# Patient Record
Sex: Male | Born: 1940 | Race: White | Hispanic: No | Marital: Married | Smoking: Former smoker
Health system: Southern US, Community
[De-identification: ages and names within clinical notes are randomized; demographics above are authoritative.]

## PROBLEM LIST (undated history)

## (undated) DIAGNOSIS — C649 Malignant neoplasm of unspecified kidney, except renal pelvis: Secondary | ICD-10-CM

## (undated) DIAGNOSIS — E785 Hyperlipidemia, unspecified: Secondary | ICD-10-CM

## (undated) DIAGNOSIS — I10 Essential (primary) hypertension: Secondary | ICD-10-CM

## (undated) HISTORY — PX: OTHER SURGICAL HISTORY: SHX169

## (undated) HISTORY — PX: TONSILLECTOMY: SUR1361

## (undated) HISTORY — DX: Hyperlipidemia, unspecified: E78.5

## (undated) HISTORY — DX: Essential (primary) hypertension: I10

## (undated) HISTORY — PX: HERNIA REPAIR: SHX51

## (undated) HISTORY — DX: Malignant neoplasm of unspecified kidney, except renal pelvis: C64.9

---

## 1999-06-17 ENCOUNTER — Ambulatory Visit (HOSPITAL_BASED_OUTPATIENT_CLINIC_OR_DEPARTMENT_OTHER): Admission: RE | Admit: 1999-06-17 | Discharge: 1999-06-17 | Payer: Self-pay | Admitting: Surgery

## 1999-08-06 ENCOUNTER — Ambulatory Visit (HOSPITAL_COMMUNITY): Admission: RE | Admit: 1999-08-06 | Discharge: 1999-08-06 | Payer: Self-pay | Admitting: Gastroenterology

## 2004-06-18 ENCOUNTER — Ambulatory Visit (HOSPITAL_COMMUNITY): Admission: RE | Admit: 2004-06-18 | Discharge: 2004-06-18 | Payer: Self-pay | Admitting: Internal Medicine

## 2007-01-28 ENCOUNTER — Encounter: Admission: RE | Admit: 2007-01-28 | Discharge: 2007-01-28 | Payer: Self-pay | Admitting: Internal Medicine

## 2009-05-29 ENCOUNTER — Encounter (INDEPENDENT_AMBULATORY_CARE_PROVIDER_SITE_OTHER): Payer: Self-pay | Admitting: Surgery

## 2009-05-29 ENCOUNTER — Ambulatory Visit (HOSPITAL_BASED_OUTPATIENT_CLINIC_OR_DEPARTMENT_OTHER): Admission: RE | Admit: 2009-05-29 | Discharge: 2009-05-29 | Payer: Self-pay | Admitting: Surgery

## 2011-02-20 HISTORY — PX: KIDNEY SURGERY: SHX687

## 2011-03-31 LAB — BASIC METABOLIC PANEL
BUN: 11 mg/dL (ref 6–23)
Calcium: 8.8 mg/dL (ref 8.4–10.5)
Chloride: 103 mEq/L (ref 96–112)
Creatinine, Ser: 0.69 mg/dL (ref 0.4–1.5)
Sodium: 137 mEq/L (ref 135–145)

## 2011-05-06 NOTE — Op Note (Signed)
Jose Monroe, ELWOOD                 ACCOUNT NO.:  [id number]   MEDICAL RECORD NO.:  [id number]          PATIENT TYPE:  AMB   LOCATION:  [facility]                          FACILITY:  [facility]   PHYSICIAN:  Thornton Park. Daphine Deutscher, MD  DATE OF BIRTH:  05-06-1941   DATE OF PROCEDURE:  DATE OF DISCHARGE:                               OPERATIVE REPORT   PREOPERATIVE DIAGNOSIS:  Left inguinal hernia.   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   SURGEON:  Thornton Park. Daphine Deutscher, MD   ASSISTANT:  None.   ANESTHESIA:  General by LMA.   DESCRIPTION OF PROCEDURE:  This 70 year old gentleman was taken to room  3 of [facility] on Tuesday, May 29, 2009, given general  anesthesia.  The abdomen was clipped and then prepped with a Techni-Care  equivalent, draped sterilely.  An oblique incision was made in the left  inguinal region, carried down through the fairly generous fat layer to  the external oblique.  These were incised along the fibers opening  allowing me to then mobilize the cord and pull it up.  I went proximally  to the ring and looked for a sac.  I did not see an obvious sac, but saw  this large mass going into the scrotum which was a large lipoma.  I  dissected this from the cord and then brought it up to the internal  ring.  I then did a high ligation with 2 sutures of 4-0 Vicryl.  This  was then retracted up into the abdomen.  No other indirect defects were  seen.  The floor was somewhat lax, so what I did is I repaired the floor  with a piece of Ultrapro mesh, suturing along the inguinal ligament with  running 2-0 Prolene.  Medially with running 2-0 Prolene cutting it,  bringing around the cord structures, and sutured it to itself.  This was  then tucked beneath the external oblique which was then closed over it.  Area was injected with 10 mL of 0.25% Marcaine.  The subcutaneous was  closed 4-0 Vicryl and the skin was closed with subcuticular 4-0  Vicryl,  Benzoin, and Steri-Strips.  The patient tolerated the procedure well,  was taken to the recovery room.  He will be given some Vicodin for pain.  He will be followed up in the office in 2 weeks.      Thornton Park Daphine Deutscher, MD  Electronically Signed     MBM/MEDQ  D:  05/29/2009  T:  05/30/2009  Job:  [id number]   cc:   Georgann Housekeeper, MD

## 2014-01-25 ENCOUNTER — Ambulatory Visit (INDEPENDENT_AMBULATORY_CARE_PROVIDER_SITE_OTHER): Payer: Medicare Other | Admitting: Family Medicine

## 2014-01-25 ENCOUNTER — Other Ambulatory Visit (INDEPENDENT_AMBULATORY_CARE_PROVIDER_SITE_OTHER): Payer: Medicare Other

## 2014-01-25 ENCOUNTER — Telehealth: Payer: Self-pay | Admitting: *Deleted

## 2014-01-25 VITALS — BP 130/62 | HR 91 | Temp 98.1°F | Resp 16 | Wt 186.1 lb

## 2014-01-25 DIAGNOSIS — M1710 Unilateral primary osteoarthritis, unspecified knee: Secondary | ICD-10-CM | POA: Insufficient documentation

## 2014-01-25 DIAGNOSIS — M6530 Trigger finger, unspecified finger: Secondary | ICD-10-CM | POA: Insufficient documentation

## 2014-01-25 DIAGNOSIS — M19049 Primary osteoarthritis, unspecified hand: Secondary | ICD-10-CM | POA: Insufficient documentation

## 2014-01-25 NOTE — Telephone Encounter (Signed)
Patient phoned wanting to know how long Dr. Creig Hines wants him to wear the finger brace.  Please advise.   CB# [phone]

## 2014-01-25 NOTE — Assessment & Plan Note (Signed)
We did attempt to try to do an injection but secondary to patient's fusion I do not know how much this will help. We discussed wearing gloves at night and over-the-counter medicines it can help with arthritic pain. We'll followup again in 3-4 weeks for further evaluation.

## 2014-01-25 NOTE — Assessment & Plan Note (Signed)
Patient was also given a splint today. Patient will wear the splint and we discussed different range of motion exercises he can be beneficial. Post injection instructions given. If this continues he may need a repeat injection in the future.

## 2014-01-25 NOTE — Telephone Encounter (Signed)
48 hours then only at night for 2 weeks.

## 2014-01-25 NOTE — Assessment & Plan Note (Signed)
Patient did have an injection today with some improvement. Patient was given information about potential viscous supplementation. In addition this patient was given a brace that he can wear while he is on vacation. Patient will do home exercises and we discussed over-the-counter medications and can be beneficial. We'll see patient back again in 3-4 weeks for further evaluation.

## 2014-01-25 NOTE — Progress Notes (Signed)
Pre-visit discussion using our clinic review tool. No additional management support is needed unless otherwise documented below in the visit note.  

## 2014-01-25 NOTE — Patient Instructions (Addendum)
Very nice to meet you Wear brace with activity Ice 20 minutes 2 times a day to the knee.  Try exercises for knee 3 times a day Take tylenol 650 mg three times a day is the best evidence based medicine we have for arthritis.  Glucosamine sulfate 750mg  twice a day is a supplement that has been shown to help moderate to severe arthritis. Vitamin D 4000 IU daily Fish oil 2 grams daily.  Tumeric 500mg  twice daily.  B6 100mg  daily Capsaicin topically up to four times a day may also help with pain. Cortisone injections are an option if these interventions do not seem to make a difference or need more relief.  If cortisone injections do not help, there are different types of shots that may help but they take longer to take effect.  We can discuss this at follow up.  It's important that you continue to stay active. Controlling your weight is important.  Consider physical therapy to strengthen muscles around the joint that hurts to take pressure off of the joint itself. Shoe inserts with good arch support may be helpful.  [employer] orthotics at [employer] could help.  Water aerobics and cycling with low resistance are the best two types of exercise for arthritis. Come back and see me in 3 weeks.

## 2014-01-25 NOTE — Progress Notes (Signed)
CC: Knee pain hand pain.   HPI: Patient is a 73 year old gentleman coming in with a complaint of knee pain. Patient states that his knee pain is bilaterally but the right side is much worse. Patient states it hurts more when he relates or if the weather changes. Patient denies any mechanical symptoms such as locking or giving out on him or denies any radiation or numbness. Patient states that he does have to stop time to time secondary to pain. Patient denies any nighttime awakening. 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 Patient denies any fevers or chills or abnormal weight loss at this time. Patient has been followed by other providers in the [facility]. The patient's knee easily tried Tylenol which does take the edge off. Patient rates the severity of 6/10.  In addition this patient has had bilateral hand pain. Patient has had pain in the right hand that seems to have a finger that it stuck from time to time. Patient points to the fourth finger. Patient denies any significant pain with this toe. Patient's left hand no does have significant pain mostly of the second finger and is unable to bend it anymore at this time. Patient denies any injuries. Patient rates the severity of these finger pains of approximately 5/10. Patient is left-handed and has not tried any relief modalities other than Tylenol.   Past medical, surgical, family and social history reviewed. Medications reviewed all in the electronic medical record.   Review of Systems: No headache, visual changes, nausea, vomiting, diarrhea, constipation, dizziness, abdominal pain, skin rash, fevers, chills, night sweats, weight loss, swollen lymph nodes, body aches, joint swelling, muscle aches, chest pain, shortness of breath, mood changes.   Objective:    Blood  pressure 130/62, pulse 91, temperature 98.1 F (36.7 C), temperature source Oral, resp. rate 16, weight 186 lb 1.9 oz (84.423 kg), SpO2 97.00%.   General: No apparent distress alert and oriented x3 mood and affect normal, dressed appropriately.  HEENT: Pupils equal, extraocular movements intact Respiratory: Patient's speak in full sentences and does not appear short of breath Cardiovascular: No lower extremity edema, non tender, no erythema Skin: Warm dry intact with no signs of infection or rash on extremities or on axial skeleton. Abdomen: Soft nontender Neuro: Cranial nerves II through XII are intact, neurovascularly intact in all extremities with 2+ DTRs and 2+ pulses. Lymph: No lymphadenopathy of posterior or anterior cervical chain or axillae bilaterally.  Gait normal with good balance and coordination.  MSK: Non tender with full range of motion and good stability and symmetric strength and tone of shoulders, elbows, wrist, hip, and ankles bilaterally.  Knee: Bilateral On inspection patient does have osteoarthritic changes. Palpation shows tenderness of the medial and lateral joint line bilaterally. ROM full in flexion and extension and lower leg rotation but does have significant crepitus Ligaments with solid consistent endpoints including ACL, PCL, LCL, MCL. Positive Mcmurray's, Apley's, and Thessalonian tests. painful patellar compression. Patellar glide with severe crepitus. Patellar and quadriceps tendons unremarkable. Hamstring and quadriceps strength is normal.  Contralateral knee unremarkable  MSK US performed of: Right fourth finger This study was ordered, performed, and interpreted by Charlann Boxer D.O.  Patient does have what appears to be a nodule within the tendon sheath of the A1 pulley. Underlying bone does show any abnormalities except for some mild  osteoporosis of the joint. Left hand second finger shows the patient does have severe osteoarthritis with autofusion of  the joint.  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  After informed written and verbal consent, patient was seated on exam table. Right knee was prepped with alcohol swab and utilizing anterolateral approach, patient's right knee space was injected with 4:1  marcaine 0.5%: Kenalog 40mg /dL. Patient tolerated the procedure well without immediate complications.  After verbal consent patient was prepped with alcohol swabs and with a 26-gauge half inch needle patient was injected with 0.5 cc of 0.5% Marcaine and 0.5 cc of Kenalog 40 mg/dL in 2 different areas. Patient had one injection into the A1 pulley of the trigger finger on the right hand as well as the PIP joint on the second finger of the left hand. Patient tolerated the procedures very well and had decreased pain immediately.  Impression and Recommendations:     This case required medical decision making of moderate complexity.

## 2014-01-26 NOTE — Telephone Encounter (Signed)
Phoned patient and spoke with spouse and relayed MD's recommendations regarding finger brance.  Understanding and appreciation verbalized.

## 2014-04-13 ENCOUNTER — Ambulatory Visit (INDEPENDENT_AMBULATORY_CARE_PROVIDER_SITE_OTHER): Payer: Medicare Other | Admitting: Family Medicine

## 2014-04-13 VITALS — BP 130/82 | HR 80 | Wt 181.0 lb

## 2014-04-13 DIAGNOSIS — M1710 Unilateral primary osteoarthritis, unspecified knee: Secondary | ICD-10-CM

## 2014-04-13 NOTE — Assessment & Plan Note (Signed)
Patient had Synvisc injection once a day. We discussed continued home exercises as well as over-the-counter medications. Patient will continue with good shoes. Patient back in one week for the second in the series of injections.

## 2014-04-13 NOTE — Progress Notes (Signed)
  CC: Knee pain, bilaterally  HPI: Patient is a 73 year old gentleman coming in with a complaint of knee pain. Patient is returning for his bilateral knee pain. Patient has had this pain for quite some time and has failed conservative therapy including corticosteroid injections at last visit. Patient states that did help for approximately 1 week and then seems to come back. In the past patient has done other things such as 2 rounds of physical therapy as well as anti-inflammatories without any significant improvement. Patient is also try topical medication without any significant permanent. Patient states it is affecting his activities of daily living and is wondering what else we can do.    Past medical, surgical, family and social history reviewed. Medications reviewed all in the electronic medical record.   Review of Systems: No headache, visual changes, nausea, vomiting, diarrhea, constipation, dizziness, abdominal pain, skin rash, fevers, chills, night sweats, weight loss, swollen lymph nodes, body aches, joint swelling, muscle aches, chest pain, shortness of breath, mood changes.   Objective:    Blood pressure 130/82, pulse 80, weight 181 lb (82.101 kg), SpO2 98.00%.   General: No apparent distress alert and oriented x3 mood and affect normal, dressed appropriately.  HEENT: Pupils equal, extraocular movements intact Respiratory: Patient's speak in full sentences and does not appear short of breath Cardiovascular: No lower extremity edema, non tender, no erythema Skin: Warm dry intact with no signs of infection or rash on extremities or on axial skeleton. Abdomen: Soft nontender Neuro: Cranial nerves II through XII are intact, neurovascularly intact in all extremities with 2+ DTRs and 2+ pulses. Lymph: No lymphadenopathy of posterior or anterior cervical chain or axillae bilaterally.  Gait normal with good balance and coordination.  MSK: Non tender with full range of motion and good  stability and symmetric strength and tone of shoulders, elbows, wrist, hip, and ankles bilaterally.  Knee: Bilateral On inspection patient does have osteoarthritic changes. Palpation shows tenderness of the medial and lateral joint line bilaterally. ROM full in flexion and extension and lower leg rotation but does have significant crepitus Ligaments with solid consistent endpoints including ACL, PCL, LCL, MCL. Positive Mcmurray's, Apley's, and Thessalonian tests. painful patellar compression. Patellar glide with severe crepitus. Patellar and quadriceps tendons unremarkable. Hamstring and quadriceps strength is normal.  Contralateral knee unremarkable  Right knee 16 mg/2.5 mL of Synvisc (sodium hyaluronate) in a prefilled syringe was injected easily into the knee through a 22-gauge needle.  Left knee 16 mg/2.5 mL of Synvisc (sodium hyaluronate) in a prefilled syringe was injected easily into the knee through a 22-gauge needle.   Impression and Recommendations:     This case required medical decision making of moderate complexity.

## 2014-04-13 NOTE — Patient Instructions (Signed)
Good to see you We will do another injection next week Continue the exercises and wear brace only with significant activity.

## 2014-04-20 ENCOUNTER — Ambulatory Visit (INDEPENDENT_AMBULATORY_CARE_PROVIDER_SITE_OTHER): Payer: Medicare Other | Admitting: Family Medicine

## 2014-04-20 VITALS — BP 124/76 | HR 80

## 2014-04-20 DIAGNOSIS — M1710 Unilateral primary osteoarthritis, unspecified knee: Secondary | ICD-10-CM

## 2014-04-20 NOTE — Assessment & Plan Note (Signed)
Synvisc #2 injection today. Patient will continue the exercises, bracing, and icing. Patient will come back again in one week for his third and final Synvisc injection.

## 2014-04-20 NOTE — Progress Notes (Signed)
  CC: Knee pain, bilaterally  HPI: Patient is a 73 year old gentleman coming in with a complaint of knee pain. Patient was seen previously was started on the Synvisc injections after failing all conservative therapy. Patient did have one injection already and states that he has had significant intermittent is not on any pain medications. Patient states his decrease the pain by approximately 50%. No new symptoms.    Past medical, surgical, family and social history reviewed. Medications reviewed all in the electronic medical record.   Review of Systems: No headache, visual changes, nausea, vomiting, diarrhea, constipation, dizziness, abdominal pain, skin rash, fevers, chills, night sweats, weight loss, swollen lymph nodes, body aches, joint swelling, muscle aches, chest pain, shortness of breath, mood changes.   Objective:    Blood pressure 124/76, pulse 80, SpO2 98.00%.   General: No apparent distress alert and oriented x3 mood and affect normal, dressed appropriately.  HEENT: Pupils equal, extraocular movements intact Respiratory: Patient's speak in full sentences and does not appear short of breath Cardiovascular: No lower extremity edema, non tender, no erythema Skin: Warm dry intact with no signs of infection or rash on extremities or on axial skeleton. Abdomen: Soft nontender Neuro: Cranial nerves II through XII are intact, neurovascularly intact in all extremities with 2+ DTRs and 2+ pulses. Lymph: No lymphadenopathy of posterior or anterior cervical chain or axillae bilaterally.  Gait normal with good balance and coordination.  MSK: Non tender with full range of motion and good stability and symmetric strength and tone of shoulders, elbows, wrist, hip, and ankles bilaterally.  Knee: Bilateral On inspection patient does have osteoarthritic changes. Palpation shows tenderness of the medial and lateral joint line bilaterally. ROM full in flexion and extension and lower leg rotation but  does have significant crepitus Ligaments with solid consistent endpoints including ACL, PCL, LCL, MCL. Positive Mcmurray's, Apley's, and Thessalonian tests. painful patellar compression. Patellar glide with severe crepitus. Patellar and quadriceps tendons unremarkable. Hamstring and quadriceps strength is normal.  Contralateral knee unremarkable  Right knee 16 mg/2.5 mL of Synvisc (sodium hyaluronate) in a prefilled syringe was injected easily into the knee through a 22-gauge needle.  Left knee 16 mg/2.5 mL of Synvisc (sodium hyaluronate) in a prefilled syringe was injected easily into the knee through a 22-gauge needle.   Impression and Recommendations:     This case required medical decision making of moderate complexity.

## 2014-04-20 NOTE — Patient Instructions (Signed)
Good to see you Keep on doing what you are doing.  See you in 1 week.

## 2014-04-27 ENCOUNTER — Ambulatory Visit (INDEPENDENT_AMBULATORY_CARE_PROVIDER_SITE_OTHER): Payer: Medicare Other | Admitting: Family Medicine

## 2014-04-27 DIAGNOSIS — M6530 Trigger finger, unspecified finger: Secondary | ICD-10-CM

## 2014-04-27 DIAGNOSIS — M1710 Unilateral primary osteoarthritis, unspecified knee: Secondary | ICD-10-CM

## 2014-04-27 NOTE — Assessment & Plan Note (Signed)
Injected again ring finger 5/7

## 2014-04-27 NOTE — Assessment & Plan Note (Signed)
Third and final injection of Synvisc today in the knees bilaterally. We discussed continued home exercises as well as active. Discuss the possibility of topical anti-inflammatories which patient declined. Patient will start over-the-counter medications it was suggested. Patient will continue icing and will followup in 4 weeks for further evaluation the

## 2014-04-27 NOTE — Progress Notes (Signed)
  CC: Knee pain, bilaterally  HPI: Patient is a 73 year old gentleman coming in with a complaint of knee pain. Patient was seen previously was started on the Synvisc injections after failing all conservative therapy.He is here for injection #3 bilaterally.  Patient states that the pain is approximately 80% improved. No new symptoms.   Past medical, surgical, family and social history reviewed. Medications reviewed all in the electronic medical record.   Review of Systems: No headache, visual changes, nausea, vomiting, diarrhea, constipation, dizziness, abdominal pain, skin rash, fevers, chills, night sweats, weight loss, swollen lymph nodes, body aches, joint swelling, muscle aches, chest pain, shortness of breath, mood changes.   Objective:    There were no vitals taken for this visit.   General: No apparent distress alert and oriented x3 mood and affect normal, dressed appropriately.  HEENT: Pupils equal, extraocular movements intact Respiratory: Patient's speak in full sentences and does not appear short of breath Cardiovascular: No lower extremity edema, non tender, no erythema Skin: Warm dry intact with no signs of infection or rash on extremities or on axial skeleton. Abdomen: Soft nontender Neuro: Cranial nerves II through XII are intact, neurovascularly intact in all extremities with 2+ DTRs and 2+ pulses. Lymph: No lymphadenopathy of posterior or anterior cervical chain or axillae bilaterally.  Gait normal with good balance and coordination.  MSK: Non tender with full range of motion and good stability and symmetric strength and tone of shoulders, elbows, wrist, hip, and ankles bilaterally.  Knee: Bilateral On inspection patient does have osteoarthritic changes. Palpation shows tenderness of the medial and lateral joint line bilaterally. ROM full in flexion and extension and lower leg rotation but does have significant crepitus Ligaments with solid consistent endpoints including  ACL, PCL, LCL, MCL. Positive Mcmurray's, Apley's, and Thessalonian tests. painful patellar compression. Patellar glide with severe crepitus. Patellar and quadriceps tendons unremarkable. Hamstring and quadriceps strength is normal.  Contralateral knee unremarkable  Right knee 16 mg/2.5 mL of Synvisc (sodium hyaluronate) in a prefilled syringe was injected easily into the knee through a 22-gauge needle.  Left knee 16 mg/2.5 mL of Synvisc (sodium hyaluronate) in a prefilled syringe was injected easily into the knee through a 22-gauge needle.   Impression and Recommendations:     This case required medical decision making of moderate complexity.

## 2014-05-25 ENCOUNTER — Ambulatory Visit (INDEPENDENT_AMBULATORY_CARE_PROVIDER_SITE_OTHER): Payer: Medicare Other | Admitting: Family Medicine

## 2014-05-25 VITALS — BP 130/70 | HR 82 | Ht 71.0 in | Wt 183.0 lb

## 2014-05-25 DIAGNOSIS — M1710 Unilateral primary osteoarthritis, unspecified knee: Secondary | ICD-10-CM

## 2014-05-25 DIAGNOSIS — M6530 Trigger finger, unspecified finger: Secondary | ICD-10-CM

## 2014-05-25 NOTE — Assessment & Plan Note (Signed)
Patient had another injection today and tolerated the procedure well. Patient will wear a splint for the next 24 hours and wear nightly thereafter. Patient will continue with manual massage and home exercises that has been given to him previously. Patient will follow up again in 2 weeks if not completely resolved.

## 2014-05-25 NOTE — Patient Instructions (Signed)
Good to see you Have Marge call. Would love to see her.  For your finger.  Wear splint next 24 hours then at night for next week.  Should improve over the next week. See me when you need me.

## 2014-05-25 NOTE — Progress Notes (Signed)
  CC: Knee pain, bilaterally  HPI: Patient is a 73 year old gentleman coming in with a complaint of knee pain. Patient completed the Synvisc injections last month. Patient states he is very happy with the results. Patient states he is approximately 90% better. Has some mild discomfort from time to time especially when he gets cold-weather outside but overall can do all activities of daily living. Patient is able to go up and downstairs without any pain.  Patient also has a trigger finger again on the were right ring finger. Patient had this injection multiple months ago. Patient had some improvement for a while but starts to have worsening triggering. Patient states that the pain is minimal. No numbness associated.   Past medical, surgical, family and social history reviewed. Medications reviewed all in the electronic medical record.   Review of Systems: No headache, visual changes, nausea, vomiting, diarrhea, constipation, dizziness, abdominal pain, skin rash, fevers, chills, night sweats, weight loss, swollen lymph nodes, body aches, joint swelling, muscle aches, chest pain, shortness of breath, mood changes.   Objective:    Blood pressure 130/70, pulse 82, height 5\' 11"  (1.803 m), weight 183 lb (83.008 kg), SpO2 94.00%.   General: No apparent distress alert and oriented x3 mood and affect normal, dressed appropriately.  HEENT: Pupils equal, extraocular movements intact Respiratory: Patient's speak in full sentences and does not appear short of breath Cardiovascular: No lower extremity edema, non tender, no erythema Skin: Warm dry intact with no signs of infection or rash on extremities or on axial skeleton. Abdomen: Soft nontender Neuro: Cranial nerves II through XII are intact, neurovascularly intact in all extremities with 2+ DTRs and 2+ pulses. Lymph: No lymphadenopathy of posterior or anterior cervical chain or axillae bilaterally.  Gait normal with good balance and coordination.  MSK:  Non tender with full range of motion and good stability and symmetric strength and tone of shoulders, elbows, wrist, hip, and ankles bilaterally.  Knee: Bilateral On inspection patient does have osteoarthritic changes. The patient has minimal tenderness over the medial and lateral joint line bilaterally. ROM full in flexion and extension and lower leg rotation but does have significant crepitus Ligaments with solid consistent endpoints including ACL, PCL, LCL, MCL. Negative Mcmurray's, Apley's, and Thessalonian tests. painful patellar compression. Patellar glide with mild to moderate crepitus. Patellar and quadriceps tendons unremarkable. Hamstring and quadriceps strength is normal.  Contralateral knee unremarkable  Hand exam shows the patient does have a nodule at the A1 pulley of the ring finger. Mild tenderness to palpation. Neurovascular intact distally.  Limited muscular skeletal ultrasound was done and shows the patient is a nodule within the tendon sheath of the A1 pulley. Patient was prepped with alcohol swabs and with a 25-gauge 1 inch needle was injected with 0.5 cc of 0.5% Marcaine and 0.5 cc of Kenalog 40 mg/dL under ultrasound guidance patient tolerated the procedure very well. Post injection instructions given.     Impression and Recommendations:     This case required medical decision making of moderate complexity.

## 2014-05-25 NOTE — Assessment & Plan Note (Signed)
Patient is doing remarkably better after the Synvisc series. Patient encouraged to continue the home exercises as well as the icing program. Patient will continue to do well and we can or should repeat the viscous supplementation every 6 months if necessary. Optimistic that this will last longer than that.  Spent greater than 25 minutes with patient face-to-face and had greater than 50% of counseling including as described above in assessment and plan.

## 2014-12-27 ENCOUNTER — Encounter: Payer: Self-pay | Admitting: Podiatry

## 2014-12-27 ENCOUNTER — Ambulatory Visit (INDEPENDENT_AMBULATORY_CARE_PROVIDER_SITE_OTHER): Payer: Medicare Other | Admitting: Podiatry

## 2014-12-27 VITALS — BP 133/71 | HR 65 | Resp 12 | Ht 71.5 in | Wt 180.0 lb

## 2014-12-27 DIAGNOSIS — R0989 Other specified symptoms and signs involving the circulatory and respiratory systems: Secondary | ICD-10-CM

## 2014-12-27 DIAGNOSIS — L608 Other nail disorders: Secondary | ICD-10-CM

## 2014-12-27 NOTE — Progress Notes (Signed)
   Subjective:    Patient ID: Jose Monroe, male    DOB: 1941/08/21, 74 y.o.   MRN: [id number]  HPI Comments: N debridement L 10 toenails  D and O long-term C elongated, tenderness to toenails A diabetes, arthritis and Macular degeneration T home pedicure    Patient states that he is a prediabetic and has difficulty trimming his nails. He admits that he has cause some bleeding when he attempts to trim his toenails which is done recently.  He denies any history of foot ulceration or claudication  He is a former smoker for 40 years smoking 1 pack daily. He discontinued smoking approximately 10 years ago   Review of Systems  HENT: Positive for congestion and sinus pressure.   Musculoskeletal: Positive for arthralgias.  All other systems reviewed and are negative.      Objective:   Physical Exam  Orientated 3  Vascular: DP pulses 2/4 bilaterally PT pulses 0/4 bilaterally Capillary reflex immediate bilaterally  Neurological: Ankle reflex equal and reactive bilaterally Vibratory sensation intact bilaterally Sensation to 10 g monofilament wire intact 5/5 bilaterally  Dermatological: Atrophic skin without hair growth bilaterally There are small areas of abrasions with crusting noted on dorsal aspect toes 2-4 left and 2 and 5 right These lesions apparently were cause when patient attempted trim his toenails The toenails are neatly trimmed 10 Nails of cells are mildly incurvated with occasional minor color changes  Musculoskeletal: Medial longitudinal arch contour bilaterally There is no restriction ankle, subtalar, midtarsal joints bilaterally     Assessment & Plan:   Assessment: Decreased posterior tibial pulses bilaterally rule out vascular disease Note neurological deep deficits Noninfected multiple abrasions on the dorsum of right and left feet Mildly incurvated toenails that need minimal trimming  Plan: Patient advised that I had difficulty palpating the  posterior tibial pulses and 1 to have further evaluation Refer patient to vascular lab for lower extremity arterial Doppler bilaterally for the indication of decreased posterior tibial pulses, prediabetes and 40 year history of smoking  I did debrided several nails as non-dystrophic Advised patient that he should not further trim his nails as he seems to have difficulty He could go to the pedicurist for nail debridement as nails are mildly incurvated  Notify patient results of lower extremity arterial Doppler

## 2014-12-27 NOTE — Patient Instructions (Addendum)
Refer patient to vascular lab for lower extremity arterial Doppler bilaterally for the indication of decreased pedal pulses and prediabetes Our office will contact you with the results of your arterial Doppler examination  Diabetes and Foot Care Diabetes may cause you to have problems because of poor blood supply (circulation) to your feet and legs. This may cause the skin on your feet to become thinner, break easier, and heal more slowly. Your skin may become dry, and the skin may peel and crack. You may also have nerve damage in your legs and feet causing decreased feeling in them. You may not notice minor injuries to your feet that could lead to infections or more serious problems. Taking care of your feet is one of the most important things you can do for yourself.  HOME CARE INSTRUCTIONS  Wear shoes at all times, even in the house. Do not go barefoot. Bare feet are easily injured.  Check your feet daily for blisters, cuts, and redness. If you cannot see the bottom of your feet, use a mirror or ask someone for help.  Wash your feet with warm water (do not use hot water) and mild soap. Then pat your feet and the areas between your toes until they are completely dry. Do not soak your feet as this can dry your skin.  Apply a moisturizing lotion or petroleum jelly (that does not contain alcohol and is unscented) to the skin on your feet and to dry, brittle toenails. Do not apply lotion between your toes.  Trim your toenails straight across. Do not dig under them or around the cuticle. File the edges of your nails with an emery board or nail file.  Do not cut corns or calluses or try to remove them with medicine.  Wear clean socks or stockings every day. Make sure they are not too tight. Do not wear knee-high stockings since they may decrease blood flow to your legs.  Wear shoes that fit properly and have enough cushioning. To break in new shoes, wear them for just a few hours a day. This  prevents you from injuring your feet. Always look in your shoes before you put them on to be sure there are no objects inside.  Do not cross your legs. This may decrease the blood flow to your feet.  If you find a minor scrape, cut, or break in the skin on your feet, keep it and the skin around it clean and dry. These areas may be cleansed with mild soap and water. Do not cleanse the area with peroxide, alcohol, or iodine.  When you remove an adhesive bandage, be sure not to damage the skin around it.  If you have a wound, look at it several times a day to make sure it is healing.  Do not use heating pads or hot water bottles. They may burn your skin. If you have lost feeling in your feet or legs, you may not know it is happening until it is too late.  Make sure your health care provider performs a complete foot exam at least annually or more often if you have foot problems. Report any cuts, sores, or bruises to your health care provider immediately. SEEK MEDICAL CARE IF:   You have an injury that is not healing.  You have cuts or breaks in the skin.  You have an ingrown nail.  You notice redness on your legs or feet.  You feel burning or tingling in your legs or feet.  You have pain or cramps in your legs and feet.  Your legs or feet are numb.  Your feet always feel cold. SEEK IMMEDIATE MEDICAL CARE IF:   There is increasing redness, swelling, or pain in or around a wound.  There is a red line that goes up your leg.  Pus is coming from a wound.  You develop a fever or as directed by your health care provider.  You notice a bad smell coming from an ulcer or wound. Document Released: 12/05/2000 Document Revised: 08/10/2013 Document Reviewed: 05/17/2013 [facility] Patient Information 2015 [place], [place]. This information is not intended to replace advice given to you by your health care provider. Make sure you discuss any questions you have with your health care provider.

## 2015-01-05 ENCOUNTER — Ambulatory Visit (HOSPITAL_COMMUNITY): Payer: Medicare Other | Attending: Internal Medicine | Admitting: *Deleted

## 2015-01-05 DIAGNOSIS — R0989 Other specified symptoms and signs involving the circulatory and respiratory systems: Secondary | ICD-10-CM | POA: Insufficient documentation

## 2015-01-05 NOTE — Progress Notes (Signed)
Arterial Doppler Single Level Performed

## 2015-04-24 ENCOUNTER — Encounter: Payer: Self-pay | Admitting: Cardiology

## 2015-04-24 ENCOUNTER — Ambulatory Visit (INDEPENDENT_AMBULATORY_CARE_PROVIDER_SITE_OTHER): Payer: Medicare Other | Admitting: Cardiology

## 2015-04-24 ENCOUNTER — Encounter: Payer: Self-pay | Admitting: *Deleted

## 2015-04-24 VITALS — BP 130/68 | HR 85 | Ht 71.5 in | Wt 182.5 lb

## 2015-04-24 DIAGNOSIS — E785 Hyperlipidemia, unspecified: Secondary | ICD-10-CM | POA: Diagnosis not present

## 2015-04-24 DIAGNOSIS — I359 Nonrheumatic aortic valve disorder, unspecified: Secondary | ICD-10-CM | POA: Diagnosis not present

## 2015-04-24 DIAGNOSIS — I350 Nonrheumatic aortic (valve) stenosis: Secondary | ICD-10-CM | POA: Diagnosis not present

## 2015-04-24 DIAGNOSIS — I10 Essential (primary) hypertension: Secondary | ICD-10-CM | POA: Insufficient documentation

## 2015-04-24 NOTE — Progress Notes (Signed)
     HPI: 74 year old male for evaluation of aortic stenosis. Previously followed at [facility]. Echocardiogram February 2013 showed normal LV function. There was mild left atrial enlargement. There was moderate aortic stenosis with mean gradient of 24 mmHg. Mild aortic insufficiency. There was mild tricuspid regurgitation. Patient would prefer to be followed here. He notes mild increased dyspnea on exertion over the past 2 years with vigorous activities. No orthopnea, PND, pedal edema, chest pain, palpitations or syncope.  Current Outpatient Prescriptions  Medication Sig Dispense Refill  . Acetaminophen (TYLENOL PO) Take by mouth.    Marland Kitchen aspirin 81 MG tablet Take 81 mg by mouth daily.    Marland Kitchen atorvastatin (LIPITOR) 20 MG tablet     . benazepril (LOTENSIN) 10 MG tablet     . glucosamine-chondroitin 500-400 MG tablet Take 2 tablets by mouth daily.    . Multiple Vitamins-Minerals (PRESERVISION/LUTEIN PO) Take by mouth.    . omega-3 acid ethyl esters (LOVAZA) 1 G capsule Take by mouth 2 (two) times daily.    . Pyridoxine HCl (VITAMIN B-6) 250 MG tablet Take 250 mg by mouth daily.    . VESICARE 10 MG tablet      No current facility-administered medications for this visit.    Allergies  Allergen Reactions  . Wasp Venom Swelling     Past Medical History  Diagnosis Date  . Hypertension   . Hyperlipidemia   . Diabetes mellitus without complication     Diet controlled  . 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 Kidney surgery Right 02/2011    kidney cancer  . Hernia repair    . Arm surgery Right     tumor  . Tonsillectomy      History   Social History  . Marital Status: Married    Spouse Name: N/A  . Number of Children: 2  . Years of Education: N/A   Occupational History  . Not on file.   Social History Main Topics  . Smoking status: Former Research scientist (life sciences)  . Smokeless tobacco: Not on file  . Alcohol  Use: 0.0 oz/week    0 Standard drinks or equivalent per week     Comment: 2 drinks per night  . Drug Use: Not on file  . Sexual Activity: Not on file   Other Topics Concern  . Not on file   Social History Narrative    Family History  Problem Relation Age of Onset  . Heart disease Sister     ROS: no fevers or chills, productive cough, hemoptysis, dysphasia, odynophagia, melena, hematochezia, dysuria, hematuria, rash, seizure activity, orthopnea, PND, pedal edema, claudication. Remaining systems are negative.  Physical Exam:   Blood pressure 130/68, pulse 85, height 5' 11.5" (1.816 m), weight 182 lb 8 oz (82.781 kg).  General:  Well developed/well nourished in NAD Skin warm/dry Patient not depressed No peripheral clubbing Back-normal HEENT-normal/normal eyelids Neck supple/normal carotid upstroke bilaterally; no bruits; no JVD; no thyromegaly chest - CTA/ normal expansion CV - RRR/normal S1 and S2; no rubs or gallops;  PMI nondisplaced, 3/6 systolic murmur left sternal border radiating to the carotids. S2 is diminished. Abdomen -NT/ND, no HSM, no mass, + bowel sounds, no bruit 2+ femoral pulses, no bruits Ext-no edema, chords, 2+ DP Neuro-grossly nonfocal  ECG Sinus rhythm, right bundle branch block.

## 2015-04-24 NOTE — Assessment & Plan Note (Signed)
Continue statin. 

## 2015-04-24 NOTE — Patient Instructions (Signed)
Your physician wants you to follow-up in: [place] will receive a reminder letter in the mail two months in advance. If you don't receive a letter, please call our office to schedule the follow-up appointment.   Your physician has requested that you have an echocardiogram. Echocardiography is a painless test that uses sound waves to create images of your heart. It provides your doctor with information about the size and shape of your heart and how well your heart's chambers and valves are working. This procedure takes approximately one hour. There are no restrictions for this procedure.

## 2015-04-24 NOTE — Assessment & Plan Note (Signed)
Blood pressure controlled. Continue present medications. 

## 2015-04-24 NOTE — Assessment & Plan Note (Signed)
Patient has a history of moderate aortic stenosis by echocardiogram 3 years ago. It sounds to be more severe on examination. Plan repeat echocardiogram. He notes mild increased dyspnea with vigorous activities but otherwise not symptomatic. I explained aortic stenosis and that he will likely require aortic valve replacement in the future. I explained the symptoms of dyspnea, chest pain and syncope to be aware of.

## 2015-05-02 ENCOUNTER — Encounter: Payer: Self-pay | Admitting: Family Medicine

## 2015-05-02 ENCOUNTER — Ambulatory Visit (INDEPENDENT_AMBULATORY_CARE_PROVIDER_SITE_OTHER): Payer: Medicare Other | Admitting: Family Medicine

## 2015-05-02 VITALS — BP 118/64 | HR 85 | Ht 71.5 in | Wt 184.0 lb

## 2015-05-02 DIAGNOSIS — M1710 Unilateral primary osteoarthritis, unspecified knee: Secondary | ICD-10-CM

## 2015-05-02 NOTE — Patient Instructions (Signed)
Good to see you We did the injections in your knee and can start the synvisc again Ice is your friend in 6 hours See you next week and we will inject the fingers as well.

## 2015-05-02 NOTE — Progress Notes (Signed)
  CC: Knee pain, bilaterally  HPI: Patient is a 74 year old gentleman coming in with a complaint of knee pain. Patient was seen greater than one year ago. 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 Patient did have Synvisc injections.  Patient overall has been doing very well until the last couple months. Started having worsening pain again. Describes it as a dull throbbing aching sensation. Nothing that stops him from activities but it is painful enough that it makes him consider not doing some activities. Patient has some travel plans coming up and would like to be also do some more regular basis.   Past medical, surgical, family and social history reviewed. Medications reviewed all in the electronic medical record.   Review of Systems: No headache, visual changes, nausea, vomiting, diarrhea, constipation, dizziness, abdominal pain, skin rash, fevers, chills, night sweats, weight loss, swollen lymph nodes, body aches, joint swelling, muscle aches, chest pain, shortness of breath, mood changes.   Objective:    Blood pressure 118/64, pulse 85, height 5' 11.5" (1.816 m), weight 184 lb (83.462 kg), SpO2 97 %.   General: No apparent distress alert and oriented x3 mood and affect normal, dressed appropriately.  HEENT: Pupils equal, extraocular movements intact Respiratory: Patient's speak in full sentences and does not appear short of breath Cardiovascular: No lower extremity edema, non tender, no erythema Skin: Warm dry intact with no signs of infection or rash on extremities or on axial skeleton. Abdomen: Soft nontender Neuro: Cranial nerves II through XII are intact, neurovascularly intact in all extremities with 2+ DTRs and 2+ pulses. Lymph: No lymphadenopathy of posterior or anterior cervical chain or axillae bilaterally.  Gait normal with good balance and coordination.  MSK: Non tender with full range of motion and good stability and symmetric strength and  tone of shoulders, elbows, wrist, hip, and ankles bilaterally.  Knee: Bilateral On inspection patient does have osteoarthritic changes. Palpation shows tenderness of the medial and lateral joint line bilaterally. ROM full in flexion and extension and lower leg rotation but does have significant crepitus Ligaments with solid consistent endpoints including ACL, PCL, LCL, MCL. Positive Mcmurray's, Apley's, and Thessalonian tests. painful patellar compression. Patellar glide with severe crepitus. Patellar and quadriceps tendons unremarkable. Hamstring and quadriceps strength is normal.  Contralateral knee unremarkable   After informed written and verbal consent, patient was seated on exam table. Right knee was prepped with alcohol swab and utilizing anterolateral approach, patient's right knee space was injected with 4:1  marcaine 0.5%: Kenalog 40mg /dL. Patient tolerated the procedure well without immediate complications.  After informed written and verbal consent, patient was seated on exam table. Left knee was prepped with alcohol swab and utilizing anterolateral approach, patient's left knee space was injected with 4:1  marcaine 0.5%: Kenalog 40mg /dL. Patient tolerated the procedure well without immediate complications.    Impression and Recommendations:     This case required medical decision making of moderate complexity.

## 2015-05-02 NOTE — Assessment & Plan Note (Signed)
Patient given injections in the knees bilaterally today. Patient will come back in 1-2 weeks. If having worsening symptoms we will restart the Synvisc injections potentially. We will discuss and evaluate.  Patient will continue home exercise, icing protocol and patient given a trial topical anti-inflammatory's.  Spent  25 minutes with patient face-to-face and had greater than 50% of counseling including as described above in assessment and plan.

## 2015-05-02 NOTE — Progress Notes (Signed)
Pre visit review using our clinic review tool, if applicable. No additional management support is needed unless otherwise documented below in the visit note. 

## 2015-05-03 ENCOUNTER — Ambulatory Visit (HOSPITAL_COMMUNITY)
Admission: RE | Admit: 2015-05-03 | Discharge: 2015-05-03 | Disposition: A | Discharge status: Home / Self Care | Payer: Medicare Other | Source: Ambulatory Visit | Attending: Cardiovascular Disease | Admitting: Cardiovascular Disease

## 2015-05-03 DIAGNOSIS — Z905 Acquired absence of kidney: Secondary | ICD-10-CM | POA: Insufficient documentation

## 2015-05-03 DIAGNOSIS — I517 Cardiomegaly: Secondary | ICD-10-CM | POA: Diagnosis not present

## 2015-05-03 DIAGNOSIS — Z87891 Personal history of nicotine dependence: Secondary | ICD-10-CM | POA: Diagnosis not present

## 2015-05-03 DIAGNOSIS — E785 Hyperlipidemia, unspecified: Secondary | ICD-10-CM | POA: Insufficient documentation

## 2015-05-03 DIAGNOSIS — I359 Nonrheumatic aortic valve disorder, unspecified: Secondary | ICD-10-CM

## 2015-05-03 DIAGNOSIS — I10 Essential (primary) hypertension: Secondary | ICD-10-CM | POA: Insufficient documentation

## 2015-05-03 DIAGNOSIS — Z923 Personal history of irradiation: Secondary | ICD-10-CM | POA: Insufficient documentation

## 2015-05-03 DIAGNOSIS — I350 Nonrheumatic aortic (valve) stenosis: Secondary | ICD-10-CM | POA: Insufficient documentation

## 2015-05-03 DIAGNOSIS — I2510 Atherosclerotic heart disease of native coronary artery without angina pectoris: Secondary | ICD-10-CM | POA: Insufficient documentation

## 2015-05-03 DIAGNOSIS — Z85528 Personal history of other malignant neoplasm of kidney: Secondary | ICD-10-CM | POA: Insufficient documentation

## 2015-05-10 ENCOUNTER — Encounter: Payer: Self-pay | Admitting: Family Medicine

## 2015-05-10 ENCOUNTER — Ambulatory Visit (INDEPENDENT_AMBULATORY_CARE_PROVIDER_SITE_OTHER): Payer: Medicare Other | Admitting: Family Medicine

## 2015-05-10 VITALS — BP 130/64 | HR 92 | Ht 71.5 in | Wt 181.0 lb

## 2015-05-10 DIAGNOSIS — M1712 Unilateral primary osteoarthritis, left knee: Secondary | ICD-10-CM | POA: Diagnosis not present

## 2015-05-10 DIAGNOSIS — M65341 Trigger finger, right ring finger: Secondary | ICD-10-CM | POA: Diagnosis not present

## 2015-05-10 DIAGNOSIS — M1711 Unilateral primary osteoarthritis, right knee: Secondary | ICD-10-CM | POA: Diagnosis not present

## 2015-05-10 DIAGNOSIS — M1710 Unilateral primary osteoarthritis, unspecified knee: Secondary | ICD-10-CM

## 2015-05-10 DIAGNOSIS — M65332 Trigger finger, left middle finger: Secondary | ICD-10-CM | POA: Diagnosis not present

## 2015-05-10 DIAGNOSIS — M6530 Trigger finger, unspecified finger: Secondary | ICD-10-CM

## 2015-05-10 NOTE — Patient Instructions (Signed)
Good to see you Ice is your friend Continue the exercises Consider bracing the finger for next 2 days See me again in 1 week.

## 2015-05-10 NOTE — Assessment & Plan Note (Signed)
he given injections today. Patient tolerated the procedures very well. Discussed icing regimen. Discussed bracing. Patient will come back in 2 weeks for further evaluation.

## 2015-05-10 NOTE — Progress Notes (Signed)
CC: Knee pain, bilaterally  HPI: Patient is a 74 year old gentleman coming in with a complaint of knee pain. Patient states that this knee pain is bilateral. Last time I saw patient patient was given a steroid injections in both knees. This is 2 weeks ago. No significant improvement. Patient has failed all other conservative therapy at this time. Patient has been formal physical therapy, bracing, and topical anti-inflammatories.  Patient is also complaining trigger fingers. These of the central fingers is had previously. This is the third finger on the right hand as well as the third and fourth finger on the left hand. No significant pain but does affect his daily activities.   Past medical, surgical, family and social history reviewed. Medications reviewed all in the electronic medical record.   Review of Systems: No headache, visual changes, nausea, vomiting, diarrhea, constipation, dizziness, abdominal pain, skin rash, fevers, chills, night sweats, weight loss, swollen lymph nodes, body aches, joint swelling, muscle aches, chest pain, shortness of breath, mood changes.   Objective:    Blood pressure 130/64, pulse 92, height 5' 11.5" (1.816 m), weight 181 lb (82.101 kg), SpO2 98 %.   General: No apparent distress alert and oriented x3 mood and affect normal, dressed appropriately.  HEENT: Pupils equal, extraocular movements intact Respiratory: Patient's speak in full sentences and does not appear short of breath Cardiovascular: No lower extremity edema, non tender, no erythema Skin: Warm dry intact with no signs of infection or rash on extremities or on axial skeleton. Abdomen: Soft nontender Neuro: Cranial nerves II through XII are intact, neurovascularly intact in all extremities with 2+ DTRs and 2+ pulses. Lymph: No lymphadenopathy of posterior or anterior cervical chain or axillae bilaterally.  Gait normal with good balance and coordination.  MSK: Non tender with full range of motion  and good stability and symmetric strength and tone of shoulders, elbows, wrist, hip, and ankles bilaterally.  Knee: Bilateral On inspection patient does have osteoarthritic changes. Palpation shows tenderness of the medial and lateral joint line bilaterally. ROM full in flexion and extension and lower leg rotation but does have significant crepitus Ligaments with solid consistent endpoints including ACL, PCL, LCL, MCL. Positive Mcmurray's, Apley's, and Thessalonian tests. painful patellar compression. Patellar glide with severe crepitus. Patellar and quadriceps tendons unremarkable. Hamstring and quadriceps strength is normal.  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 Moderate osteophytic changes noted.  After informed written and verbal consent, patient was seated on exam table. Right knee was prepped with alcohol swab and utilizing anterolateral approach, patient's right knee space was injected with 16 mg/2.5 mL of Synvisc (sodium hyaluronate) in a prefilled syringe was injected easily into the knee through a 22-gauge needle. Patient tolerated the procedure well without immediate complications.  After informed written and verbal consent, patient was seated on exam table. Left knee was prepped with alcohol swab and utilizing anterolateral approach, patient's left knee space was injected with16 mg/2.5 mL of Synvisc (sodium hyaluronate) in a prefilled syringe was injected easily into the knee through a 22-gauge needle.. Patient tolerated the procedure well without immediate complications.  Right ring finger Limited muscular skeletal ultrasound was done and shows the patient is a nodule within the tendon sheath of the A1 pulley. Patient was prepped with alcohol swabs and with a 25-gauge 1 inch  needle was  injected with 0.5 cc of 0.5% Marcaine and 0.5 cc of Kenalog 40 mg/dL under ultrasound guidance patient tolerated the procedure very well. Post injection instructions given.  Left middle finger Limited muscular skeletal ultrasound was done and shows the patient is a nodule within the tendon sheath of the A1 pulley. Patient was prepped with alcohol swabs and with a 25-gauge 1 inch needle was injected with 0.5 cc of 0.5% Marcaine and 0.5 cc of Kenalog 40 mg/dL under ultrasound guidance patient tolerated the procedure very well. Post injection instructions given.  Impression and Recommendations:     This case required medical decision making of moderate complexity.

## 2015-05-10 NOTE — Progress Notes (Signed)
Pre visit review using our clinic review tool, if applicable. No additional management support is needed unless otherwise documented below in the visit note. 

## 2015-05-10 NOTE — Assessment & Plan Note (Signed)
Patient started this Synvisc series again today. Patient did have good resolution of pain for proximal and we will Neer and is looking for the same type or results. Patient will come back in one week but will continue conservative therapy at this time.

## 2015-05-16 ENCOUNTER — Ambulatory Visit (INDEPENDENT_AMBULATORY_CARE_PROVIDER_SITE_OTHER): Payer: Medicare Other | Admitting: Family Medicine

## 2015-05-16 ENCOUNTER — Encounter: Payer: Self-pay | Admitting: Family Medicine

## 2015-05-16 VITALS — BP 130/62 | HR 82

## 2015-05-16 DIAGNOSIS — M170 Bilateral primary osteoarthritis of knee: Secondary | ICD-10-CM

## 2015-05-16 DIAGNOSIS — M1710 Unilateral primary osteoarthritis, unspecified knee: Secondary | ICD-10-CM

## 2015-05-16 NOTE — Patient Instructions (Signed)
Good to see you Ice is your friend See you in a week for final injections Have a great memorial day!\I call you when we get the pennsaid

## 2015-05-16 NOTE — Assessment & Plan Note (Signed)
Arthritis series of 3 injections given in the knees bilaterally. Had #2 back in 1 week.

## 2015-05-16 NOTE — Progress Notes (Signed)
  CC: Knee pain, bilaterally  HPI: Patient is a 74 year old gentleman coming in with a complaint of knee pain. Patient states that this knee pain is bilateral. Last time I saw patient patient was given a steroid injections in both knees. This is 2 weeks ago. No significant improvement. Patient has failed all other conservative therapy at this time. Patient has been formal physical therapy, bracing, and topical anti-inflammatories. Here for injection #2 bilaterally.      Past medical, surgical, family and social history reviewed. Medications reviewed all in the electronic medical record.   Review of Systems: No headache, visual changes, nausea, vomiting, diarrhea, constipation, dizziness, abdominal pain, skin rash, fevers, chills, night sweats, weight loss, swollen lymph nodes, body aches, joint swelling, muscle aches, chest pain, shortness of breath, mood changes.   Objective:    Blood pressure 130/62, pulse 82, SpO2 95 %.   General: No apparent distress alert and oriented x3 mood and affect normal, dressed appropriately.  HEENT: Pupils equal, extraocular movements intact Respiratory: Patient's speak in full sentences and does not appear short of breath Cardiovascular: No lower extremity edema, non tender, no erythema Skin: Warm dry intact with no signs of infection or rash on extremities or on axial skeleton. Abdomen: Soft nontender Neuro: Cranial nerves II through XII are intact, neurovascularly intact in all extremities with 2+ DTRs and 2+ pulses. Lymph: No lymphadenopathy of posterior or anterior cervical chain or axillae bilaterally.  Gait normal with good balance and coordination.  MSK: Non tender with full range of motion and good stability and symmetric strength and tone of shoulders, elbows, wrist, hip, and ankles bilaterally.  Knee: Bilateral On inspection patient does have osteoarthritic changes. Palpation shows tenderness of the medial and lateral joint line bilaterally. ROM  full in flexion and extension and lower leg rotation but does have significant crepitus Ligaments with solid consistent endpoints including ACL, PCL, LCL, MCL. Positive Mcmurray's, Apley's, and Thessalonian tests. painful patellar compression. Patellar glide with severe crepitus. Patellar and quadriceps tendons unremarkable. Hamstring and quadriceps strength is normal.  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 Moderate osteophytic changes noted.  After informed written and verbal consent, patient was seated on exam table. Right knee was prepped with alcohol swab and utilizing anterolateral approach, patient's right knee space was injected with 16 mg/2.5 mL of Synvisc (sodium hyaluronate) in a prefilled syringe was injected easily into the knee through a 22-gauge needle. Patient tolerated the procedure well without immediate complications.  After informed written and verbal consent, patient was seated on exam table. Left knee was prepped with alcohol swab and utilizing anterolateral approach, patient's left knee space was injected with16 mg/2.5 mL of Synvisc (sodium hyaluronate) in a prefilled syringe was injected easily into the knee through a 22-gauge needle.. Patient tolerated the procedure well without immediate complications.    Impression and Recommendations:     This case required medical decision making of moderate complexity.

## 2015-05-23 ENCOUNTER — Encounter: Payer: Self-pay | Admitting: Family Medicine

## 2015-05-23 ENCOUNTER — Ambulatory Visit (INDEPENDENT_AMBULATORY_CARE_PROVIDER_SITE_OTHER): Payer: Medicare Other | Admitting: Family Medicine

## 2015-05-23 VITALS — BP 110/66 | HR 85 | Ht 71.5 in | Wt 181.0 lb

## 2015-05-23 DIAGNOSIS — M170 Bilateral primary osteoarthritis of knee: Secondary | ICD-10-CM

## 2015-05-23 DIAGNOSIS — M1710 Unilateral primary osteoarthritis, unspecified knee: Secondary | ICD-10-CM

## 2015-05-23 NOTE — Patient Instructions (Signed)
You made it 4 weeks until you see me again  Ice is your friend OCnitnue to stay active You know where I am if you need me.

## 2015-05-23 NOTE — Assessment & Plan Note (Signed)
Third and final injections given today. Patient will follow-up in 4 weeks and we'll continue conservative therapy.

## 2015-05-23 NOTE — Progress Notes (Signed)
  CC: Knee pain, bilaterally  HPI: Patient is a 74 year old gentleman coming in with a complaint of knee pain. Patient states that this knee pain is bilateral. Last time I saw patient patient was given a steroid injections in both knees. This is 2 weeks ago. No significant improvement. Patient has failed all other conservative therapy at this time. Patient has been formal physical therapy, bracing, and topical anti-inflammatories. Here for injection #3 bilaterally. Patient states that he continues to improve. Patient over Memorial Day weekend it significant amount walking and had very minimal pain overall.     Past medical, surgical, family and social history reviewed. Medications reviewed all in the electronic medical record.   Review of Systems: No headache, visual changes, nausea, vomiting, diarrhea, constipation, dizziness, abdominal pain, skin rash, fevers, chills, night sweats, weight loss, swollen lymph nodes, body aches, joint swelling, muscle aches, chest pain, shortness of breath, mood changes.   Objective:    Blood pressure 110/66, pulse 85, height 5' 11.5" (1.816 m), weight 181 lb (82.101 kg), SpO2 96 %.   General: No apparent distress alert and oriented x3 mood and affect normal, dressed appropriately.  HEENT: Pupils equal, extraocular movements intact Respiratory: Patient's speak in full sentences and does not appear short of breath Cardiovascular: No lower extremity edema, non tender, no erythema Skin: Warm dry intact with no signs of infection or rash on extremities or on axial skeleton. Abdomen: Soft nontender Neuro: Cranial nerves II through XII are intact, neurovascularly intact in all extremities with 2+ DTRs and 2+ pulses. Lymph: No lymphadenopathy of posterior or anterior cervical chain or axillae bilaterally.  Gait normal with good balance and coordination.  MSK: Non tender with full range of motion and good stability and symmetric strength and tone of shoulders, elbows,  wrist, hip, and ankles bilaterally.  Knee: Bilateral On inspection patient does have osteoarthritic changes. Palpation shows tenderness of the medial and lateral joint line bilaterally. ROM full in flexion and extension and lower leg rotation but does have significant crepitus Ligaments with solid consistent endpoints including ACL, PCL, LCL, MCL. Positive Mcmurray's, Apley's, and Thessalonian tests. painful patellar compression. Patellar glide with severe crepitus. Patellar and quadriceps tendons unremarkable. Hamstring and quadriceps strength is normal.  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 Moderate osteophytic changes noted.  After informed written and verbal consent, patient was seated on exam table. Right knee was prepped with alcohol swab and utilizing anterolateral approach, patient's right knee space was injected with 16 mg/2.5 mL of Synvisc (sodium hyaluronate) in a prefilled syringe was injected easily into the knee through a 22-gauge needle. Patient tolerated the procedure well without immediate complications.  After informed written and verbal consent, patient was seated on exam table. Left knee was prepped with alcohol swab and utilizing anterolateral approach, patient's left knee space was injected with16 mg/2.5 mL of Synvisc (sodium hyaluronate) in a prefilled syringe was injected easily into the knee through a 22-gauge needle.. Patient tolerated the procedure well without immediate complications.    Impression and Recommendations:     This case required medical decision making of moderate complexity.

## 2015-05-23 NOTE — Progress Notes (Signed)
Pre visit review using our clinic review tool, if applicable. No additional management support is needed unless otherwise documented below in the visit note. 

## 2015-10-02 ENCOUNTER — Other Ambulatory Visit: Payer: Self-pay | Admitting: *Deleted

## 2015-10-02 MED ORDER — SOLIFENACIN SUCCINATE 10 MG PO TABS: 10.0000 mg | ORAL_TABLET | Freq: Every day | ORAL | Status: DC

## 2015-10-26 ENCOUNTER — Ambulatory Visit (INDEPENDENT_AMBULATORY_CARE_PROVIDER_SITE_OTHER): Payer: Medicare Other | Admitting: Cardiology

## 2015-10-26 ENCOUNTER — Encounter: Payer: Self-pay | Admitting: Cardiology

## 2015-10-26 DIAGNOSIS — I350 Nonrheumatic aortic (valve) stenosis: Secondary | ICD-10-CM

## 2015-10-26 NOTE — Assessment & Plan Note (Signed)
Continue statin. 

## 2015-10-26 NOTE — Patient Instructions (Signed)
Medication Instructions:   NO CHANGE  Testing/Procedures:  Your physician has requested that you have an echocardiogram. Echocardiography is a painless test that uses sound waves to create images of your heart. It provides your doctor with information about the size and shape of your heart and how well your heart's chambers and valves are working. This procedure takes approximately one hour. There are no restrictions for this procedure.SCHEDULE IN MAY    Follow-Up:  Your physician wants you to follow-up in: MAY 2017 AFTER ECHO WITH DR Stanford Breed You will receive a reminder letter in the mail two months in advance. If you don't receive a letter, please call our office to schedule the follow-up appointment.   If you need a refill on your cardiac medications before your next appointment, please call your pharmacy.

## 2015-10-26 NOTE — Assessment & Plan Note (Signed)
Last echocardiogram showed moderate heading toward severe aortic stenosis. He does not have significant dyspnea, chest pain or syncope. We discussed the symptoms to be aware of. We will plan to repeat his echocardiogram in May and I'll see him back immediately afterwards. I have explained that he will require aortic valve replacement in the future.

## 2015-10-26 NOTE — Assessment & Plan Note (Signed)
Blood pressure controlled. Continue present medications. 

## 2015-10-26 NOTE — Progress Notes (Signed)
HPI: FU aortic stenosis. Previously followed at [facility]. Echocardiogram February 2013 showed normal LV function. There was mild left atrial enlargement. There was moderate aortic stenosis with mean gradient of 24 mmHg. Mild aortic insufficiency. There was mild tricuspid regurgitation. ABIs with Doppler January 2016 normal. Echocardiogram repeated in May 2016. Ejection fraction 60-65% with mild left ventricular hypertrophy. Rate 1 diastolic dysfunction. Moderate to severe aortic stenosis with mean gradient 38 mmHg. Trace aortic insufficiency. Mild left atrial enlargement. Since last seen, the patient has dyspnea with more extreme activities but not with routine activities. It is relieved with rest. It is not associated with chest pain. There is no orthopnea, PND or pedal edema. There is no syncope or palpitations. There is no exertional chest pain.   Current Outpatient Prescriptions  Medication Sig Dispense Refill  . EPINEPHrine (EPIPEN 2-PAK) 0.3 mg/0.3 mL IJ SOAJ injection Use as directed    . acetaminophen (TYLENOL) 500 MG tablet Take 500 mg by mouth daily as needed.    Marland Kitchen aspirin 81 MG tablet Take 81 mg by mouth daily.    Marland Kitchen atorvastatin (LIPITOR) 20 MG tablet     . benazepril (LOTENSIN) 10 MG tablet     . glucosamine-chondroitin 500-400 MG tablet Take 2 tablets by mouth daily.    Marland Kitchen loratadine (CLARITIN) 10 MG tablet Take 10 mg by mouth daily as needed.    . Multiple Vitamins-Minerals (PRESERVISION/LUTEIN PO) Take by mouth.    . Multiple Vitamins-Minerals (PRESERVISION/LUTEIN) CAPS Take 1 capsule by mouth 2 (two) times daily.    Marland Kitchen omega-3 acid ethyl esters (LOVAZA) 1 G capsule Take by mouth 2 (two) times daily.    . Pyridoxine HCl (VITAMIN B-6) 250 MG tablet Take 250 mg by mouth daily.    . solifenacin (VESICARE) 10 MG tablet Take 1 tablet (10 mg total) by mouth daily. 90 tablet 3  . Vitamin D, Cholecalciferol, 400 UNITS CAPS Take 1 capsule by mouth daily.     No current  facility-administered medications for this visit.     Past Medical History  Diagnosis Date  . Hypertension   . Hyperlipidemia   . Diabetes mellitus without complication ([place])     Diet controlled  . 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 Kidney surgery Right 02/2011    kidney cancer  . Hernia repair    . Arm surgery Right     tumor  . Tonsillectomy      Social History   Social History  . Marital Status: Married    Spouse Name: N/A  . Number of Children: 2  . Years of Education: N/A   Occupational History  . Not on file.   Social History Main Topics  . Smoking status: Former Research scientist (life sciences)  . Smokeless tobacco: Not on file  . Alcohol Use: 0.0 oz/week    0 Standard drinks or equivalent per week     Comment: 2 drinks per night  . Drug Use: Not on file  . Sexual Activity: Not on file   Other Topics Concern  . Not on file   Social History Narrative    ROS: no fevers or chills, productive cough, hemoptysis, dysphasia, odynophagia, melena, hematochezia, dysuria, hematuria, rash, seizure activity, orthopnea, PND, pedal edema, claudication. Remaining systems are negative.  Physical Exam: Well-developed well-nourished in no acute distress.  Skin is warm and dry.  HEENT is normal.  Neck is supple.  Chest is clear to auscultation with normal expansion.  Cardiovascular exam is regular rate and rhythm. 3/6 systolic murmur left sternal border. S2 is diminished. Abdominal exam nontender or distended. No masses palpated. Extremities show no edema. neuro grossly intact  ECG Sinus rhythm at a rate of 91. Right bundle branch block.

## 2015-12-19 ENCOUNTER — Other Ambulatory Visit: Payer: Self-pay | Admitting: Gastroenterology

## 2016-03-11 ENCOUNTER — Encounter (HOSPITAL_COMMUNITY): Payer: Self-pay

## 2016-03-11 ENCOUNTER — Ambulatory Visit (HOSPITAL_COMMUNITY): Admit: 2016-03-11 | Payer: PRIVATE HEALTH INSURANCE | Admitting: Gastroenterology

## 2016-03-11 SURGERY — COLONOSCOPY WITH PROPOFOL
Anesthesia: Monitor Anesthesia Care

## 2016-07-30 ENCOUNTER — Encounter: Payer: Self-pay | Admitting: Family Medicine

## 2016-07-30 ENCOUNTER — Ambulatory Visit (INDEPENDENT_AMBULATORY_CARE_PROVIDER_SITE_OTHER): Payer: Medicare Other | Admitting: Family Medicine

## 2016-07-30 VITALS — BP 122/82 | HR 79 | Temp 99.1°F | Resp 17 | Ht 71.0 in | Wt 176.1 lb

## 2016-07-30 DIAGNOSIS — C641 Malignant neoplasm of right kidney, except renal pelvis: Secondary | ICD-10-CM | POA: Diagnosis not present

## 2016-07-30 DIAGNOSIS — I350 Nonrheumatic aortic (valve) stenosis: Secondary | ICD-10-CM

## 2016-07-30 DIAGNOSIS — I10 Essential (primary) hypertension: Secondary | ICD-10-CM | POA: Diagnosis not present

## 2016-07-30 DIAGNOSIS — E785 Hyperlipidemia, unspecified: Secondary | ICD-10-CM | POA: Diagnosis not present

## 2016-07-30 DIAGNOSIS — C799 Secondary malignant neoplasm of unspecified site: Secondary | ICD-10-CM

## 2016-07-30 NOTE — Progress Notes (Signed)
   Subjective:    Patient ID: Jose Monroe, male    DOB: 1941-05-18, 75 y.o.   MRN: [id number]  HPI New to establish.  Previous MD- Lysle Rubens  HTN- chronic problem, on Benazepril daily.  Well controlled.  Cr on 8/3 was 0.98  No CP, SOB, HAs, visual changes, edema.  Hyperlipidemia- chronic problem, on Lipitor and fish oil daily.  Due for lipid panel.  LFTs was of 8/3 WNL.  No abd pain, N/V.  Renal cell cancer- s/p radiation and R kidney removal.  Mets to R upper humerus w/ removal of bone and plate insertion at [facility] w/ Dr Redmond Pulling.  Plate recently broke and pt having surgery on Friday.  Currently on Nivolumab q14 days.  Aortic stenosis- follows w/ Dr Stanford Breed.  Denies CP, SOB, edema.   Review of Systems For ROS see HPI     Objective:   Physical Exam  Constitutional: He is oriented to person, place, and time. He appears well-developed and well-nourished. No distress.  HENT:  Head: Normocephalic and atraumatic.  Eyes: Conjunctivae and EOM are normal. Pupils are equal, round, and reactive to light.  Neck: Normal range of motion. Neck supple. No thyromegaly present.  Cardiovascular: Normal rate, regular rhythm and intact distal pulses.   Murmur (II-III/VI SEM at RUSB) heard. Pulmonary/Chest: Effort normal and breath sounds normal. No respiratory distress. He has no wheezes. He has no rales.  Abdominal: Soft. Bowel sounds are normal. He exhibits no distension. There is no tenderness. There is no rebound and no guarding.  Musculoskeletal: He exhibits no edema.  R arm in sling  Lymphadenopathy:    He has no cervical adenopathy.  Neurological: He is alert and oriented to person, place, and time. No cranial nerve deficit.  Skin: Skin is warm and dry.  Psychiatric: He has a normal mood and affect. His behavior is normal.  Vitals reviewed.         Assessment & Plan:

## 2016-07-30 NOTE — Assessment & Plan Note (Signed)
New to provider, ongoing for pt.  Currently asymptomatic w/ exception of murmur.  No evidence of volume overload.  Pt is due for cards f/u once his surgery is complete.  Will follow along and assist as able.

## 2016-07-30 NOTE — Assessment & Plan Note (Signed)
New.  Pt is currently on Nivolumab for metastatic renal cell carcinoma.  Following w/ [facility].  Has surgery on Friday to replace plate at site of bony mets.  Will follow along.

## 2016-07-30 NOTE — Assessment & Plan Note (Signed)
New to provider, ongoing for pt.  Well controlled today on Benazepril.  Reviewed recent CMP- Cr and K+ WNL.  Currently asymptomatic.  No med changes at this time.  Will follow.

## 2016-07-30 NOTE — Assessment & Plan Note (Signed)
New to provider, ongoing for pt.  On Lipitor and fish oil daily.  Currently asymptomatic.  Check labs.  Adjust meds prn.

## 2016-07-30 NOTE — Patient Instructions (Addendum)
Schedule your complete physical in 6 months We'll notify you of your lab results and make any changes if needed Continue to work on healthy diet and regular exercise- you look great! Call with any questions or concerns Welcome!!  We're glad to have you!!! Good luck with the surgery!!!

## 2016-07-30 NOTE — Progress Notes (Signed)
Pre visit review using our clinic review tool, if applicable. No additional management support is needed unless otherwise documented below in the visit note. 

## 2016-08-01 DIAGNOSIS — T84498A Other mechanical complication of other internal orthopedic devices, implants and grafts, initial encounter: Secondary | ICD-10-CM | POA: Insufficient documentation

## 2016-08-06 ENCOUNTER — Telehealth: Payer: Self-pay | Admitting: Family Medicine

## 2016-08-06 ENCOUNTER — Encounter (INDEPENDENT_AMBULATORY_CARE_PROVIDER_SITE_OTHER): Payer: Medicare Other | Admitting: Family Medicine

## 2016-08-06 DIAGNOSIS — E785 Hyperlipidemia, unspecified: Secondary | ICD-10-CM

## 2016-08-06 LAB — LIPID PANEL
CHOL/HDL RATIO: 3.9 ratio (ref <=5.0)
CHOLESTEROL: 97 mg/dL — AB (ref 125–200)
HDL: 25 mg/dL — ABNORMAL LOW (ref >=40)
LDL CALC: 47 mg/dL (ref <130)
TRIGLYCERIDES: 125 mg/dL (ref <150)
VLDL: 25 mg/dL (ref <30)

## 2016-08-06 LAB — TSH: TSH: 1.56 mIU/L (ref 0.40–4.50)

## 2016-08-06 NOTE — Progress Notes (Signed)
This encounter was created in error - please disregard.

## 2016-08-06 NOTE — Telephone Encounter (Signed)
Pt states while at [facility] having his surgery he was told by the medicine team that flomax would be good for him to take to help with his prostate. Pt asking for a Rx to be sent to [facility] in [place]

## 2016-08-07 ENCOUNTER — Encounter: Payer: Self-pay | Admitting: General Practice

## 2016-08-08 MED ORDER — TAMSULOSIN HCL 0.4 MG PO CAPS: 0.4000 mg | ORAL_CAPSULE | Freq: Every day | ORAL | 6 refills | Status: DC

## 2016-08-08 NOTE — Telephone Encounter (Signed)
Pt advised that he has urine frequency during the day and wakes up multiple times at night. Per PCP. BPH added to problem list and med filled to local pharmacy.  Pt also stated an understanding about risk of falls.

## 2016-08-08 NOTE — Telephone Encounter (Signed)
We don't have record of him having difficulty w/ his prostate.  Is he having difficulty initiating urination?  Waking frequently at night?  If yes, we can prescribe Flomax 4mg  once daily, #30, 3 refills.  If no current sxs, there doesn't seem to be a reason to start medication.  This medication can also lower BP so we need to be quite careful regarding risk of falling

## 2016-08-29 DIAGNOSIS — J849 Interstitial pulmonary disease, unspecified: Secondary | ICD-10-CM | POA: Insufficient documentation

## 2016-09-12 DIAGNOSIS — I4892 Unspecified atrial flutter: Secondary | ICD-10-CM | POA: Insufficient documentation

## 2016-09-15 ENCOUNTER — Telehealth: Payer: Self-pay | Admitting: Family Medicine

## 2016-09-15 NOTE — Telephone Encounter (Signed)
Spoke with Beverlee Nims at [facility], she advised pt is at home under care of his wife and hospice. Pt was referred from [facility] after pt had been admitted with metastatic renal cancer and also suspicious spots on his lung, bones, and lymph nodes. Pt also has increased pleural effusion. Pt had been adamant with hospice that his doctor be aware of everything that goes on.    Tried calling to speak with pt family to inform that we were made aware and to check on pt but phone only rang, and could not leave a message.

## 2016-09-15 NOTE — Telephone Encounter (Signed)
This is a big change in status for patient.  Is there a way to call [facility] and get more information

## 2016-09-15 NOTE — Telephone Encounter (Signed)
Caller with [facility] called late Friday afternoon states that pt waited her to call to make KT aware that Hospice is doing in home care for pt at this time.

## 2016-09-18 ENCOUNTER — Telehealth: Payer: Self-pay | Admitting: Family Medicine

## 2016-09-18 NOTE — Telephone Encounter (Signed)
Wife calling asking if there is anyway that KT could get a Physical Therapist for in home care, caller states that Hospice states it will take them a week to get someone and that pt has not been out of bed in a month. Wife states that they will pay out-of-pocket if needed.

## 2016-09-18 NOTE — Telephone Encounter (Signed)
This is difficult b/c once pt is Hospice, I don't think other home health agencies will provide services.  Please reach out to [facility] and [facility] and verify if this is the case.  Thank you!

## 2016-09-18 NOTE — Telephone Encounter (Signed)
Per [facility] "We would not be able to take this on as Hospice is the current provider for the pt. Hospice would have to dc the pt or agree to terms for the PT visits. I'm not aware of any situations that I have crossed where Hospice has agreed for Therapy. Hospice is typically in place for comfort care. Thank you! "   Per PCP, pt and family will be made aware.

## 2016-09-18 NOTE — Telephone Encounter (Signed)
Spoke with pt wife and she informed that the hopice PT is able to come out sooner and someone will be there tomorrow.

## 2016-09-18 NOTE — Telephone Encounter (Signed)
Will wait on their response

## 2016-09-18 NOTE — Telephone Encounter (Signed)
Called [facility] and left a voicemail, and sent Darlina Guys a staff message to inquire.

## 2016-09-23 ENCOUNTER — Telehealth: Payer: Self-pay | Admitting: Family Medicine

## 2016-09-23 NOTE — Telephone Encounter (Signed)
Spoke wit pt wife who advised that Dr. Marcello Moores the hematologist/oncologist is the one who initiated the [facility] referral. Pt wife was advised that if they needed any signatures we would be glad to oversee the treatment.

## 2016-09-23 NOTE — Telephone Encounter (Signed)
I am happy to sign home health orders and help in whatever way possible but I am not sure how he was changed from [facility]osp[facility] to [facility] w/o a referral/doctor's order.  I do not have a face-to-face w/ him in the last 30 days so I don't think I can initiate the [facility] but if his [facility] doctors ordered it, I can certainly continue it and they can transfer the care to me.  Please call family (or [name]) and get more info if possible.  Thank you!

## 2016-09-23 NOTE — Telephone Encounter (Signed)
Wife called stating that they have taken pt out of the care of Hospice and have placed in the care of [facility] so that he could get physical therapy. Wife states that this will allow KT to over see his care and wants a call back from KT to discuss next step for pt care. Wife can be reached on home number [phone] or cell [phone].

## 2016-10-02 ENCOUNTER — Telehealth: Payer: Self-pay | Admitting: Family Medicine

## 2016-10-02 NOTE — Telephone Encounter (Signed)
Called the number provided and the message says that it is no longer in service or has been disconnected.

## 2016-10-02 NOTE — Telephone Encounter (Signed)
Caller with [name] asking for a call back regarding a referral they received. Caller states it needs to be someone clinical and needs to return call before 12 noon.

## 2016-10-02 NOTE — Telephone Encounter (Signed)
Called and spoke with Jacqlyn Larsen, she wanted to know if Dr. Birdie Riddle would be willing to oversee referral for Home Health, PT, PT, and such. Verbal ok was given as pt family had already spoken with [facility] in regards to this.

## 2016-10-03 ENCOUNTER — Telehealth: Payer: Self-pay | Admitting: Family Medicine

## 2016-10-03 DIAGNOSIS — Z87891 Personal history of nicotine dependence: Secondary | ICD-10-CM

## 2016-10-03 DIAGNOSIS — I10 Essential (primary) hypertension: Secondary | ICD-10-CM

## 2016-10-03 DIAGNOSIS — Z905 Acquired absence of kidney: Secondary | ICD-10-CM

## 2016-10-03 DIAGNOSIS — Z9981 Dependence on supplemental oxygen: Secondary | ICD-10-CM

## 2016-10-03 DIAGNOSIS — J449 Chronic obstructive pulmonary disease, unspecified: Secondary | ICD-10-CM | POA: Diagnosis not present

## 2016-10-03 DIAGNOSIS — Z5181 Encounter for therapeutic drug level monitoring: Secondary | ICD-10-CM

## 2016-10-03 DIAGNOSIS — M84521D Pathological fracture in neoplastic disease, right humerus, subsequent encounter for fracture with routine healing: Secondary | ICD-10-CM

## 2016-10-03 DIAGNOSIS — C649 Malignant neoplasm of unspecified kidney, except renal pelvis: Secondary | ICD-10-CM | POA: Diagnosis not present

## 2016-10-03 DIAGNOSIS — J849 Interstitial pulmonary disease, unspecified: Secondary | ICD-10-CM | POA: Diagnosis not present

## 2016-10-03 NOTE — Telephone Encounter (Signed)
Verbal ok given.

## 2016-10-03 NOTE — Telephone Encounter (Signed)
Called and spoke with pt wife. She advised that patient currently takes: prednisone (probably forever), lasix (again forever), an acid reducer, trazodone for sleep, sennokot, and miralax.   Pt wife states that Oncology wanted to know what his potassium level was? Pt wife was offered an appt  to discuss medications and health of patient and what labs might be drawn with Dr. Birdie Riddle. She advised at that time that pt cannot come to the office without EMS transport, wanted to have someone come to their house to see patient and draw labs advised that they will "pay out of pocket" for that to happen.   Pt wife was advised that we have not received any notes or lab orders from oncology in regards to the patient's last stay in the hospital. Pt wife was advised that they would need to contact oncology and have those notes and any lab orders faxed to our office. At that time we can change the [facility] referral (as we did not place the original referral- that was from oncology for PT/OT) to include nursing and have the labs drawn at their home.   Pt wife stated an understanding and will contact oncology to verify what exaact labs and diagnosis codes they would like used and will have notes also faxed to our office.

## 2016-10-03 NOTE — Telephone Encounter (Signed)
Ruby, physical therapist with [name] calling to request verbal orders for home health physical therapy 2 times per week for 4 weeks and 1 time per week for 2 weeks.  Please return call with verbal.

## 2016-10-03 NOTE — Telephone Encounter (Signed)
Ok to proceed as indicated

## 2016-10-03 NOTE — Telephone Encounter (Signed)
Wife calling asking for a call back from KT regarding meds that pt was taking through [facility]. Also wife states that pt needs labs done and wants to know when KT would like to come to the house and do this.

## 2016-10-03 NOTE — Telephone Encounter (Signed)
Which medications in particular does she have questions about?  We do not have a discharge summary from [facility].  The last thing we can see in his [facility] Chart is from his surgery on 8/12 with some labs and an op note

## 2016-10-06 NOTE — Telephone Encounter (Signed)
Spoke w/ pt's son this weekend.  After our discussion, our goal is to get the records from [place] (please call and ask for faxed records as they are still not available in [place]) and once I have those and I am able to review them, we are going to see pt in the office for a hospital f/u, goal of care visit and we are going to schedule this w/ his son who indicated he will find a way to get pt here to office.

## 2016-10-06 NOTE — Telephone Encounter (Signed)
Pt son scheduled appt for 10-18 @ 11am. PCP received discharge notes from [place].

## 2016-10-07 ENCOUNTER — Telehealth: Payer: Self-pay | Admitting: Family Medicine

## 2016-10-07 NOTE — Telephone Encounter (Signed)
Caller is an OT w/[facility] asking for verbal orders for plan of care 1 x a wk for 4 wks, ADL trans. Left upper extremities exercise, health promotion, fall prevention, infection prevention, also needs to know if ok to receive orders from Dr Magda Bernheim regarding upper right extremities, Ok to discharge when golds are met. Caller also asking for a order to be faxed to [facility] for a drop down armrest commode. Pt unable to transfer to standard commode without assistance.

## 2016-10-07 NOTE — Telephone Encounter (Signed)
Ok to order the commode as asked

## 2016-10-07 NOTE — Telephone Encounter (Signed)
Called and left a detailed message on voicemail giving the ok for PT/OT and for additional orders from arm surgeon. Also advised that we could not place any orders with [facility] until the face-to-face tomorrow.   If caller returns call, need to verify that they do indeed want [place] to send Durable Medical Equipment order to [place]?

## 2016-10-07 NOTE — Telephone Encounter (Signed)
Please advise if ok for Home Health to receive orders from other provider (unknow what organization they are with)  Also the [facility] orders, can they be placed without a face-to-face with pt? He has an appt tomorrow?

## 2016-10-07 NOTE — Telephone Encounter (Signed)
Spoke with Jose Monroe with [name] he said that patient had received his hospital bed from [facility] that is why the commode order is being asked to be processed. [facility] should already have records on file for pt.

## 2016-10-07 NOTE — Telephone Encounter (Signed)
Ok for Dr Magda Bernheim to give orders (he is the surgeon who operated on his arm) I authorize other PT/OT orders as indicated I am confused as to why Jose Monroe is asking for orders to be sent to [facility].  Please call and clarify w/ [facility] and let them know that we likely cannot place any new orders until our face to face tomorrow

## 2016-10-08 ENCOUNTER — Telehealth: Payer: Self-pay | Admitting: Family Medicine

## 2016-10-08 ENCOUNTER — Encounter: Payer: Self-pay | Admitting: Family Medicine

## 2016-10-08 ENCOUNTER — Ambulatory Visit (INDEPENDENT_AMBULATORY_CARE_PROVIDER_SITE_OTHER): Payer: Medicare Other | Admitting: Family Medicine

## 2016-10-08 VITALS — BP 124/86 | HR 130 | Temp 98.0°F | Resp 17 | Ht 71.0 in

## 2016-10-08 DIAGNOSIS — J9621 Acute and chronic respiratory failure with hypoxia: Secondary | ICD-10-CM

## 2016-10-08 DIAGNOSIS — C641 Malignant neoplasm of right kidney, except renal pelvis: Secondary | ICD-10-CM

## 2016-10-08 DIAGNOSIS — C799 Secondary malignant neoplasm of unspecified site: Secondary | ICD-10-CM

## 2016-10-08 DIAGNOSIS — J849 Interstitial pulmonary disease, unspecified: Secondary | ICD-10-CM | POA: Diagnosis not present

## 2016-10-08 DIAGNOSIS — I10 Essential (primary) hypertension: Secondary | ICD-10-CM | POA: Diagnosis not present

## 2016-10-08 DIAGNOSIS — J9611 Chronic respiratory failure with hypoxia: Secondary | ICD-10-CM | POA: Insufficient documentation

## 2016-10-08 LAB — BASIC METABOLIC PANEL
BUN: 19 mg/dL (ref 6–23)
CALCIUM: 9.4 mg/dL (ref 8.4–10.5)
CO2: 30 mEq/L (ref 19–32)
Chloride: 96 mEq/L (ref 96–112)
Creatinine, Ser: 0.78 mg/dL (ref 0.40–1.50)
GFR: 102.83 mL/min (ref >60.00)
GLUCOSE: 174 mg/dL — AB (ref 70–99)
POTASSIUM: 4.7 meq/L (ref 3.5–5.1)
SODIUM: 134 meq/L — AB (ref 135–145)

## 2016-10-08 LAB — HEPATIC FUNCTION PANEL
ALBUMIN: 2.8 g/dL — AB (ref 3.5–5.2)
ALT: 12 U/L (ref 0–53)
AST: 13 U/L (ref 0–37)
Alkaline Phosphatase: 63 U/L (ref 39–117)
BILIRUBIN DIRECT: 0.1 mg/dL (ref 0.0–0.3)
TOTAL PROTEIN: 6.4 g/dL (ref 6.0–8.3)
Total Bilirubin: 0.5 mg/dL (ref 0.2–1.2)

## 2016-10-08 LAB — CBC WITH DIFFERENTIAL/PLATELET
BASOS ABS: 0 10*3/uL (ref 0.0–0.1)
Basophils Relative: 0 % (ref 0.0–3.0)
Eosinophils Absolute: 0 10*3/uL (ref 0.0–0.7)
Eosinophils Relative: 0.3 % (ref 0.0–5.0)
HEMATOCRIT: 36.4 % — AB (ref 39.0–52.0)
HEMOGLOBIN: 12 g/dL — AB (ref 13.0–17.0)
Lymphs Abs: 0.6 10*3/uL — ABNORMAL LOW (ref 0.7–4.0)
MCHC: 32.9 g/dL (ref 30.0–36.0)
MCV: 97.1 fl (ref 78.0–100.0)
MONOS PCT: 1.3 % — AB (ref 3.0–12.0)
Monocytes Absolute: 0.2 10*3/uL (ref 0.1–1.0)
NEUTROS ABS: 13.7 10*3/uL — AB (ref 1.4–7.7)
Neutrophils Relative %: 94.4 % — ABNORMAL HIGH (ref 43.0–77.0)
Platelets: 418 10*3/uL — ABNORMAL HIGH (ref 150.0–400.0)
RBC: 3.75 Mil/uL — AB (ref 4.22–5.81)
RDW: 15.7 % — ABNORMAL HIGH (ref 11.5–15.5)
WBC: 14.5 10*3/uL — AB (ref 4.0–10.5)

## 2016-10-08 LAB — TSH: TSH: 1.07 u[IU]/mL (ref 0.35–4.50)

## 2016-10-08 NOTE — Telephone Encounter (Signed)
Paperwork received from [facility] today, will be completed and returned after his face-to-face appt.

## 2016-10-08 NOTE — Patient Instructions (Signed)
We'll determine the next steps based on your lab results We'll call you with your pulmonary appt If Jose Monroe tells you that you need something, please have them let me know so I can order through [place] We will work on getting you all the equipment you need to get stronger! Please call with any questions or concerns- we are here to help! Hang in there!  You look great!!!

## 2016-10-08 NOTE — Telephone Encounter (Signed)
Caller with [name] asking for a verbal order for home health aide to come 2 x a wk for 3 wks to help wife with pt.

## 2016-10-08 NOTE — Telephone Encounter (Signed)
FYI, verbal ok already given today.

## 2016-10-08 NOTE — Progress Notes (Signed)
Pre visit review using our clinic review tool, if applicable. No additional management support is needed unless otherwise documented below in the visit note. 

## 2016-10-08 NOTE — Progress Notes (Signed)
   Subjective:    Patient ID: Jose Monroe, male    DOB: 1941-01-22, 75 y.o.   MRN: [id number]  [facility] f/u- pt was admitted 9/7-9/24 at [facility] w/ severe SOB.  He spent his hospitalization in the ICU w/ Optdivo induced pneumonitis.  Pt is no longer on Optdivo- has plans to f/u w/ Dr Marcello Moores (oncology at [facility]).  Pt was d/c'd home from hospital w/ Hospice but pt and family did not feel this was appropriate as pt is much more interested in doing physical therapy and getting stronger.  Pt reports feeling well but is very deconditioned and is unable to transfer from bed to chair w/o considerable assistance and difficulty.  Wears O2 at 4-5L continuously.  He has a hospital bed at home which he requires to help w/ positioning.  His long term goal is to get a travel O2 concentrator to allow him to spend time with grandchildren.  Pt continues to have shortness of breath w/ minimal exertion which is frustrating to him.  Has some R arm pain from surgery and this further complicates his rehab as he is not able to push himself up.  Comprehensive review of records from [facility] was done and med list was reconciled w/ pt and family.   Review of Systems For ROS see HPI     Objective:   Physical Exam  Constitutional: He is oriented to person, place, and time. No distress.  Thin, frail elderly man in wheelchair  HENT:  Head: Normocephalic and atraumatic.  O2 in place via [place]  Eyes: Conjunctivae and EOM are normal. Pupils are equal, round, and reactive to light.  Neck: Normal range of motion. Neck supple. No thyromegaly present.  Cardiovascular: Normal rate, regular rhythm, normal heart sounds and intact distal pulses.   Pulmonary/Chest: Effort normal and breath sounds normal. No respiratory distress. He has no wheezes. He has no rales.  Abdominal: Soft. Bowel sounds are normal. He exhibits no distension. There is no tenderness. There is no rebound and no guarding.  Musculoskeletal: He exhibits no edema.    Lymphadenopathy:    He has no cervical adenopathy.  Neurological: He is alert and oriented to person, place, and time.  Skin: Skin is warm and dry. No rash noted. No erythema.  Psychiatric: He has a normal mood and affect. His behavior is normal. Thought content normal.  Vitals reviewed.         Assessment & Plan:

## 2016-10-09 ENCOUNTER — Other Ambulatory Visit: Payer: Self-pay | Admitting: General Practice

## 2016-10-09 ENCOUNTER — Telehealth: Payer: Self-pay | Admitting: Emergency Medicine

## 2016-10-09 DIAGNOSIS — J849 Interstitial pulmonary disease, unspecified: Secondary | ICD-10-CM

## 2016-10-09 DIAGNOSIS — J9621 Acute and chronic respiratory failure with hypoxia: Secondary | ICD-10-CM

## 2016-10-09 MED ORDER — PANTOPRAZOLE SODIUM 40 MG PO TBEC: 40.0000 mg | DELAYED_RELEASE_TABLET | Freq: Every day | ORAL | 1 refills | Status: AC

## 2016-10-09 MED ORDER — FUROSEMIDE 20 MG PO TABS: 20.0000 mg | ORAL_TABLET | Freq: Every day | ORAL | 1 refills | Status: AC

## 2016-10-09 NOTE — Telephone Encounter (Signed)
Patient's wife called requesting to increase the amount of smaller travel oxygen tanks for patient. Unsure if this was a new order for [place].

## 2016-10-09 NOTE — Telephone Encounter (Signed)
Can we send order to [facility] for travel O2 concentrator?

## 2016-10-10 ENCOUNTER — Encounter: Payer: Self-pay | Admitting: Family Medicine

## 2016-10-10 NOTE — Assessment & Plan Note (Signed)
Chronic problem.  BP is adequately controlled today even with stopping his Benazepril while hospitalized.  He is currently on Lasix due to pulmonary edema so he will need repeat lab work today.  If his BP rises, we can restart the Benazepril as needed.  Pt expressed understanding and is in agreement w/ plan.

## 2016-10-10 NOTE — Assessment & Plan Note (Signed)
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  Pt has f/u w/ [facility] scheduled.  This dx would qualify pt for Hospice but his current goals (as well as his family's) include PT/OT to strengthen and improve function.  Will work w/ Home Health to ensure pt has the services and equipment he needs to reach his goals.  Pt expressed understanding and is in agreement w/ plan.

## 2016-10-10 NOTE — Telephone Encounter (Signed)
I placed this DME order today.  

## 2016-10-10 NOTE — Assessment & Plan Note (Signed)
New to provider.  Pt continues to struggle w/ shortness of breath, despite 4-5 L O2.  His lungs are CTAB today.  Will refer to new pulmonologist at his request.  Will follow along and assist as able

## 2016-10-10 NOTE — Assessment & Plan Note (Addendum)
New to provider.  Pt was admitted to [facility] ICU w/ acute respiratory failure.  Now using 4-5 L O2 via [place].  He is exceptionally deconditioned after his recent hospitalization and even transferring from bed to chair is incredibly taxing and requires a lot of assistance.  Pt was not happy w/ the pulmonary advice he was given before his hospitalization- 'take more [place]'.  Will refer to [facility] for ongoing management of his lung disease.  Pt expressed understanding and is in agreement w/ plan.  Total time spent w/ pt 60 minutes, >50% spent counseling and discussing goals of care

## 2016-10-14 ENCOUNTER — Telehealth: Payer: Self-pay | Admitting: Cardiology

## 2016-10-14 NOTE — Telephone Encounter (Signed)
Jose Monroe with [facility] returning call to confirm receipt of message she received from [place] and to get clarification on request.  Reviewed message in chart and informed her.  She states she will check patient's account with [facility] and work to get the O2 concentrator.  She will call back if she has any additional questions.

## 2016-10-14 NOTE — Telephone Encounter (Signed)
Returned call to patient's wife.She stated husband saw surgeon Dr.Scott Wilson at [facility] this morning heart rate was 158.Stated they wanted him to see Dr.Crenshaw.Husband saw Dr.Thomas at [facility] this afternoon heart rate 120.Stated he is unaware of fast heart beat.Stated he feels ok no sob,no chest pain.Stated they are in car driving back home from appointments.Appointment scheduled with Kerin Ransom PA tomorrow at 2:00 pm.Advised to check pulse when they get home and if heart rate greater than 120 he needs to go to [facility] ER.   Returned call to [place] office left message patient was scheduled appointment with Kerin Ransom PA 10/15/16.Patient was advised when he gets back home if he has heart heart greater than 120 he needs to go to [facility] ER.

## 2016-10-14 NOTE — Telephone Encounter (Signed)
Jose Message  Jose Monroe from MD-Scott Jose Monroe office wanted to let us know about the pts HR during visit.  Pts HR was 158.

## 2016-10-15 ENCOUNTER — Ambulatory Visit (INDEPENDENT_AMBULATORY_CARE_PROVIDER_SITE_OTHER)
Admission: RE | Admit: 2016-10-15 | Discharge: 2016-10-15 | Disposition: A | Discharge status: Home / Self Care | Payer: Medicare Other | Source: Ambulatory Visit | Attending: Pulmonary Disease | Admitting: Pulmonary Disease

## 2016-10-15 ENCOUNTER — Ambulatory Visit (INDEPENDENT_AMBULATORY_CARE_PROVIDER_SITE_OTHER): Payer: Medicare Other | Admitting: Pulmonary Disease

## 2016-10-15 ENCOUNTER — Encounter: Payer: Self-pay | Admitting: Pulmonary Disease

## 2016-10-15 ENCOUNTER — Telehealth: Payer: Self-pay | Admitting: Family Medicine

## 2016-10-15 ENCOUNTER — Ambulatory Visit: Payer: Medicare Other | Admitting: Cardiology

## 2016-10-15 VITALS — BP 118/72 | HR 159 | Ht 71.0 in | Wt 159.0 lb

## 2016-10-15 DIAGNOSIS — J9611 Chronic respiratory failure with hypoxia: Secondary | ICD-10-CM | POA: Diagnosis not present

## 2016-10-15 DIAGNOSIS — R000 Tachycardia, unspecified: Secondary | ICD-10-CM | POA: Diagnosis not present

## 2016-10-15 DIAGNOSIS — I350 Nonrheumatic aortic (valve) stenosis: Secondary | ICD-10-CM

## 2016-10-15 DIAGNOSIS — J849 Interstitial pulmonary disease, unspecified: Secondary | ICD-10-CM | POA: Diagnosis not present

## 2016-10-15 DIAGNOSIS — I484 Atypical atrial flutter: Secondary | ICD-10-CM

## 2016-10-15 DIAGNOSIS — C799 Secondary malignant neoplasm of unspecified site: Secondary | ICD-10-CM

## 2016-10-15 DIAGNOSIS — C641 Malignant neoplasm of right kidney, except renal pelvis: Secondary | ICD-10-CM

## 2016-10-15 NOTE — Telephone Encounter (Signed)
Wife states that pt needs to be requalified with oxygen before [facility] can do anything, pt is still listed with Hospice.

## 2016-10-15 NOTE — Patient Instructions (Signed)
Keep taking prednisone as you doing Keep using oxygen as you're doing We will call you with the results of the chest x-ray We will arrange for a lung function test Keep your appointment with cardiology tomorrow After I have reviewed the images from your chest imaging from today as well as those from [facility] I will give the direction on how to taper off the prednisone. We will plan on seeing you back in 2-4 weeks either with myself or a nurse practitioner.

## 2016-10-15 NOTE — Progress Notes (Signed)
Subjective:    Patient ID: Jose Monroe, male    DOB: 1941-01-01, 75 y.o.   MRN: [id number]  HPI Chief Complaint  Patient presents with  . Advice Only    Referred for ILD, chr respiratory failure per Dr. Birdie Riddle.  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     This is a pleasant 75 year old male former smoker who comes to my clinic today to establish care for chronic respiratory failure with hypoxemia in the setting of likely interstitial lung disease felt to be induced by cancer treatment.  After growing up in [place] he went to undergraduate school at [employer]. There he had a chest x-ray performed which apparently showed "scarring related to chemicals in the [place] where he grew up". He denies working in or around chemicals at any point in his life but apparently he had some characteristic findings at age 16 in the 18s which were felt to be consistent with chemical exposure.  However, he never had much in the way of respiratory symptoms but he did smoke one pack of cigarettes daily for over 20 years. He quit in 2009. He was told that he had COPD by Dr. Deforest Hoyles with [facility] at some point in the last several years. However, he never needed to see pulmonary medicine and he never had to use oxygen.  Approximately 5 years ago he developed right shoulder pain and thought he had a rotator cuff injury and was discovered to have a right humerus mass. This was biopsied and found to be consistent with renal cell carcinoma. This is treated with chemotherapy and radiation therapy. Time went on and he had no respiratory complaints at this time. However, in May 2017 he was found to have "a lung spot and a gland spot" and he underwent a bronchoscopic biopsy which confirmed recurrent renal cell carcinoma. He was treated with Vorient (pazopanib) but apparently had "a severe reaction" which involved kidney and liver involvement. This medicine was held and then around mid July 2017 he was started  on Opdivo (nivolimab) and received 2 doses. Around mid August he developed cough with some fatigue and shortness of breath. He was seen by pulmonary medicine at [facility] who arranged for a CT scan because a chest x-ray was worrisome for progressive fibrosis. A bronchoscopy was arranged but unfortunately the patient's dyspnea progressed and he required admission to the [facility] intensive care unit for acute respiratory failure with hypoxemia in the setting of significant patchy airspace disease. He was treated with high-dose oxygen, never mechanically ventilated. He was given diuretic therapy during this time. He was discharged after 15 days in the hospital on he says is much as 8 L of oxygen.  Since hospital discharge she has no cough and he says his dyspnea has slowly improved. Yesterday he walked for the first time independently (with a walker" and he says he was fatigued but it felt good. He has been taking prednisone, initially he was taking 60 mg daily after hospital discharge, now he is taking 40 mg daily. He was initially using 8 L oxygen with exertion. However, he is now using 3 L oxygen at rest and 4 L with exertion.  He reports that his resting heart rate is somewhere in the 80s to 90s though his wife notes 3 times yesterday when checking his O2 saturation it was in the 70s. However, when he gets up and walks around his heart rate will increase dramatically to the 150 range.  Apparently the physicians at [facility] were aware of this as he was persistently tachycardic there. During his hospital stay he had an echocardiogram which showed normal LVEF but severe aortic stenosis.  He is here to see me today because he would like to establish his pulmonary medicine care in [place] along with all of his other care. 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 He has been  pleased with the care he has been receiving from [facility] up until this point.  Notably, he was discharged with intention for him to be on hospice. He and his wife decided to not go on hospice. He has been told and he understands that his cancer is incurable but his goal is to try to get as strong as possible and treat his medical condition so he can spend as much time with his grandchildren as possible.  Past Medical History:  Diagnosis Date  . Hyperlipidemia   . Hypertension   . Renal cell cancer (HCC)    Metastatic to right humerous; resected     Family History  Problem Relation Age of Onset  . Heart disease Sister   . Cancer Sister     lung  . Arthritis Mother   . Cancer Mother     lung  . Hyperlipidemia Mother   . Diabetes Mother   . Arthritis Father   . Heart disease Father   . Hyperlipidemia Father   . COPD Father   . Arthritis Maternal Grandmother   . Hyperlipidemia Paternal Grandfather      Social History   Social History  . Marital status: Married    Spouse name: N/A  . Number of children: 2  . Years of education: N/A   Occupational History  . Not on file.   Social History Main Topics  . Smoking status: Former Smoker    Packs/day: 1.00    Years: 25.00    Types: Cigarettes    Quit date: 10/15/2001  . Smokeless tobacco: Never Used  . Alcohol use 0.0 oz/week     Comment: 2 drinks per night  . Drug use: Unknown  . Sexual activity: Not on file   Other Topics Concern  . Not on file   Social History Narrative  . No narrative on file     Allergies  Allergen Reactions  . Nivolumab Shortness Of Breath    Pneumonitis     Outpatient Medications Prior to Visit  Medication Sig Dispense Refill  . acetaminophen (TYLENOL) 500 MG tablet Take 500 mg by mouth.    Marland Kitchen albuterol (PROVENTIL HFA;VENTOLIN HFA) 108 (90 Base) MCG/ACT inhaler Inhale into the lungs.    Marland Kitchen EPINEPHrine (EPIPEN 2-PAK) 0.3 mg/0.3 mL IJ SOAJ injection Use as directed    .  furosemide (LASIX) 20 MG tablet Take 1 tablet (20 mg total) by mouth daily. 90 tablet 1  . Multiple Vitamin (MULTI-VITAMINS) TABS Take by mouth.    . Multiple Vitamins-Minerals (PRESERVISION/LUTEIN PO) Take by mouth.    . pantoprazole (PROTONIX) 40 MG tablet Take 1 tablet (40 mg total) by mouth daily. 90 tablet 1  . 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 aspirin 81 MG tablet Take 81 mg by mouth daily.    . benazepril (LOTENSIN) 20 MG tablet     .  benzonatate (TESSALON) 100 MG capsule   0  . Carbonyl Iron 15 MG/1.25ML SUSP Take 60 mg by mouth.    . loratadine (CLARITIN) 10 MG tablet Take 10 mg by mouth daily as needed.    Marland Kitchen morphine (MS CONTIN) 15 MG 12 hr tablet Take 15 mg by mouth.    . morphine (MSIR) 15 MG tablet Take 15 mg by mouth.    . omega-3 acid ethyl esters (LOVAZA) 1 G capsule Take by mouth 2 (two) times daily.    . polyethylene glycol (MIRALAX / GLYCOLAX) packet Take 17 g by mouth.    . tamsulosin (FLOMAX) 0.4 MG CAPS capsule Take 0.4 mg by mouth.     No facility-administered medications prior to visit.      Review of Systems  Constitutional: Negative for fever and unexpected weight change.  HENT: Negative for congestion, dental problem, ear pain, nosebleeds, postnasal drip, rhinorrhea, sinus pressure, sneezing, sore throat and trouble swallowing.   Eyes: Negative for redness and itching.  Respiratory: Positive for shortness of breath. Negative for cough, chest tightness and wheezing.   Cardiovascular: Negative for palpitations and leg swelling.  Gastrointestinal: Negative for nausea and vomiting.  Genitourinary: Negative for dysuria.  Musculoskeletal: Negative for joint swelling.  Skin: Negative for rash.  Neurological: Negative for headaches.  Hematological: Does not bruise/bleed easily.  Psychiatric/Behavioral: Negative for dysphoric mood. The patient is  not nervous/anxious.        Objective:   Physical Exam Vitals:   10/15/16 1228  BP: 118/72  Pulse: (!) 159  SpO2: 100%  Weight: 159 lb (72.1 kg)  Height: 5\' 11"  (1.803 m)   Heart rate on my exam is 110 He is on 3 L nasal cannula  Gen: chronically ill appearing in wheel chair, no acute distress HENT: NCAT, OP clear, neck supple without masses Eyes: PERRL, EOMi Lymph: no cervical lymphadenopathy PULM: Crackles bases both lungs CV: Tachycardic, irreggularly irregular, systolic murmur RUSB, no JVD GI: BS+, soft, nontender, no hsm Derm: no rash or skin breakdown, no edema MSK: normal bulk and tone but R arm movement limited with wasted musculature Neuro: A&Ox4, CN II-XII intact, strength 5/5 in all 4 extremities Psyche: normal mood and affect  RECORD REVIEW Records from [facility] were reviewed including oncology clinic notes and pulmonary clinic notes. Specifically, the pulmonary clinic notes were visible from where he was seen just prior to his hospital visit. I also reviewed multiple chest x-ray reports which showed evidence of diffuse bilateral airspace disease in September 2017 which apparently had worsened compared to earlier in the year.  Summary of image reports from [facility] in 2017: CT Chest 05/2016 . Thoracic inlet/axilla: Heterogeneous thyroid gland with multiple nodules, similar appearance to prior. . Central airways: Within normal limits. . Mediastinum/hila: Hilar lymph nodes are now subcentimeter short axis..  . Heart/vessel: Normal heart size. Coronary artery and aortic valvular calcifications. Trace pericardial fluid or thickening.  . Lungs: Enlarging spiculated nodule within the right upper lobe, measures 1.5 x 1.4 cm (series 3 image 70), 1.3 x 1.3 cm previously. Centrilobular emphysema. Peripheral reticulations and multifocal areas of scarring. Cluster of small nodules within the right upper lobe measuring up to 0.5 cm (series 3, image 39), 0.3  cm previously. Scattered micronodules. As index; 0.2 cm subpleural nodule left lower lobe (series 3 image 300), unchanged. . Pleura: Within normal limits.    Result Narrative  CT CHEST ABDOMEN PELVIS W CONTRAST (ROUTINE), 08/26/2016 3:58 PM  1.  Increased diffuse bilateral patchy airspace and groundglass opacities with moderate left and small right pleural effusions. Findings may represent acute exacerbation of inflammatory process related to patient's fibrotic lung disease with superimposed infection not excluded. Additional differential consideration includes pulmonary toxicity secondary to medication (Nivolumab). 2.  Interval enlargement of multiple mediastinal lymph nodes, which may be reactive in nature. Recommend attention on follow-up following treatment. 3.  Status post right nephrectomy without evidence of local recurrence. 4.  Indeterminant bilateral adrenal gland thickening, which is unchanged since prior exam. 5.  Slight interval decrease in spiculated nodule within the right upper lobe . 6.  Additional ancillary findings as detailed above.   Result Narrative  CTA CHEST (PE STUDY) W CONTRAST, 08/28/2016 12:53 PM   1.  No acute pulmonary thromboembolism.  2.  Overall similar appearance of the lungs when compared to exam completed 08/26/2016 for which a differential diagnosis is cryptogenic organizing pneumonia, diffuse alveolar damage related to chemotherapy, and/or superimposed pneumonia/aspiration.  3.  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 4.  Similar appearance of bilateral pleural effusions, moderate on the left, small on the right when compared to exam dated 2 days ago. 5.  Similar borderline enlargement of multiple mediastinal lymph nodes which may be reactive in nature. Recommend continued attention to on follow-up. 6.  Likely ulcerated plaque of  the inferior most portion of the imaged abdominal aorta. 7.  Additional findings as above.   Pulmonary function test from [facility]:  Date: 08/19/16 FEV1: 2.35 L (78 %predicted) FVC: 3.5 L (80 %predicted) Ratio: 71 BD response: not tested TLC: 71 % predicted RV: 70 % predicted DLCO: 45% predicted    Echocardiogram from [facility]: Name: DEMARRE, CAVACO, Jose Bonito.   Study Date: 08/28/2016   Height: 71 in   PROCEDURE Study Quality: Technically adequate. - SUMMARY Mild left ventricular hypertrophy The left ventricular size is low normal. LV ejection fraction = 50-55%.  Left ventricular systolic function is low normal. No segmental wall motion abnormalities seen in the left ventricle. Diffuse calcification of the aortic valve. There is severe aortic stenosis. Aortic valve peak pressure gradient is 73  mmHg. Peak velocity 4.3 m/sec Aortic valve mean pressure gradient is 47 mmHg. Valve area 0.36 cm2/m2 There is mild to moderate mitral annular calcification. IVC size was mildly dilated. There is no pericardial effusion. Compared to prior TTE study in 01/2013, there is now severe aortic stenosis.  Assessment & Plan:  ILD (interstitial lung disease) ([place]) He has what appears to be a steroid responsive interstitial lung disease process which is acute in nature developing in August 2017. I agree with his physicians at [facility] but the time course strongly suggest that the anticancer agent Nivolimab is the causative agent. His oxygenation and overall respiratory status appears to be slowly improving with prednisone.  I would like to have objective evidence of this improvement.  Plan: Chest x-ray now Ambulatory oximetry check here in the office Function testing now Obtain records and imaging of his chest images from [facility] Continue prednisone 40 mg daily for now After I can review the images from [facility] and compare them to  today's chest x-ray give him some direction on how to taper down the prednisone  Chronic respiratory failure with hypoxia ([place]) This seems to be due to the interstitial lung disease as detailed above. Fortunately it seems  to be improving.  Plan: Check ambulatory oximetry for "qualifying walk" for Medicare insurance today We will prescribe oxygen through [facility] For now we will plan on him using 3 L at rest and 4 L with exertion but I suspect this will continue to improve  Atrial flutter (HCC) Today's 12-lead EKG shows that he is in atrial flutter. He has a plan for follow-up with cardiology tomorrow. This has been a persistent problem for weeks.  Plan: Follow-up with cardiology tomorrow Twelve-lead EKG today was performed  Renal cell carcinoma of right kidney metastatic to other site [facility]) He would like to transition his care to the [place]. We're happy to support that however needed.  Aortic stenosis His echocardiogram performed last month showed severe aortic stenosis. I fear that this is contributing to his dyspnea though his doctors at [facility] of done an outstanding job of keeping him euvolemic with his current diuretic regimen. I believe the atrial flutter is complicating this.  Plan: Follow-up with cardiology tomorrow    Current Outpatient Prescriptions:  .  acetaminophen (TYLENOL) 500 MG tablet, Take 500 mg by mouth., Disp: , Rfl:  .  albuterol (PROVENTIL HFA;VENTOLIN HFA) 108 (90 Base) MCG/ACT inhaler, Inhale into the lungs., Disp: , Rfl:  .  EPINEPHrine (EPIPEN 2-PAK) 0.3 mg/0.3 mL IJ SOAJ injection, Use as directed, Disp: , Rfl:  .  ferrous sulfate 325 (65 FE) MG tablet, Take 325 mg by mouth daily with breakfast., Disp: , Rfl:  .  furosemide (LASIX) 20 MG tablet, Take 1 tablet (20 mg total) by mouth daily., Disp: 90 tablet, Rfl: 1 .  Multiple Vitamin (MULTI-VITAMINS) TABS, Take by mouth., Disp: , Rfl:  .  Multiple  Vitamins-Minerals (PRESERVISION/LUTEIN PO), Take by mouth., Disp: , Rfl:  .  pantoprazole (PROTONIX) 40 MG tablet, Take 1 tablet (40 mg total) by mouth daily., Disp: 90 tablet, Rfl: 1 .  predniSONE (DELTASONE) 20 MG tablet, Take 60mg  (3 tabs) on 9/25 and then continue on 40mg  daily pending pulmonary follow up for drug induced pneumonitis, Disp: , Rfl:

## 2016-10-15 NOTE — Assessment & Plan Note (Signed)
He would like to transition his care to the [facility]. We're happy to support that however needed.

## 2016-10-15 NOTE — Assessment & Plan Note (Signed)
He has what appears to be a steroid responsive interstitial lung disease process which is acute in nature developing in August 2017. I agree with his physicians at [facility] but the time course strongly suggest that the anticancer agent Nivolimab is the causative agent. His oxygenation and overall respiratory status appears to be slowly improving with prednisone.  I would like to have objective evidence of this improvement.  Plan: Chest x-ray now Ambulatory oximetry check here in the office Function testing now Obtain records and imaging of his chest images from [facility] Continue prednisone 40 mg daily for now After I can review the images from [facility] and compare them to today's chest x-ray give him some direction on how to taper down the prednisone

## 2016-10-15 NOTE — Assessment & Plan Note (Signed)
His echocardiogram performed last month showed severe aortic stenosis. I fear that this is contributing to his dyspnea though his doctors at [facility] of done an outstanding job of keeping him euvolemic with his current diuretic regimen. I believe the atrial flutter is complicating this.  Plan: Follow-up with cardiology tomorrow

## 2016-10-15 NOTE — Telephone Encounter (Signed)
Spoke on the phone with Darlina Guys today. She advised that pt had an appt with Dr. Lake Bells today and that will be his oxygen recertification.   Pt wife was also advised today how to bring in their oxygen tanks and have them swapped out until they have the portable tanks on hand.   Melissa also advised that all other paperwork had been completed for patient and we are good to go at this time.

## 2016-10-15 NOTE — Assessment & Plan Note (Signed)
Today's 12-lead EKG shows that he is in atrial flutter. He has a plan for follow-up with cardiology tomorrow. This has been a persistent problem for weeks.  Plan: Follow-up with cardiology tomorrow Twelve-lead EKG today was performed

## 2016-10-15 NOTE — Assessment & Plan Note (Signed)
This seems to be due to the interstitial lung disease as detailed above. Fortunately it seems to be improving.  Plan: Check ambulatory oximetry for "qualifying walk" for Medicare insurance today We will prescribe oxygen through [facility] For now we will plan on him using 3 L at rest and 4 L with exertion but I suspect this will continue to improve

## 2016-10-15 NOTE — Addendum Note (Signed)
Addended by: Collier Salina on: 10/15/2016 01:49 PM   Modules accepted: Orders

## 2016-10-15 NOTE — Telephone Encounter (Signed)
Staff message sent to Darlina Guys to clarify what/if anything else needs to be done on our end to ensure that this patient is completely under the care of [facility] (PT/OT) and [facility] for supplies.

## 2016-10-15 NOTE — Telephone Encounter (Signed)
noted 

## 2016-10-16 ENCOUNTER — Encounter: Payer: Self-pay | Admitting: Cardiology

## 2016-10-16 ENCOUNTER — Ambulatory Visit: Payer: Medicare Other | Admitting: Physician Assistant

## 2016-10-16 ENCOUNTER — Ambulatory Visit (INDEPENDENT_AMBULATORY_CARE_PROVIDER_SITE_OTHER): Payer: Medicare Other | Admitting: Cardiology

## 2016-10-16 VITALS — BP 116/70 | HR 159 | Ht 70.0 in | Wt 159.0 lb

## 2016-10-16 DIAGNOSIS — C641 Malignant neoplasm of right kidney, except renal pelvis: Secondary | ICD-10-CM | POA: Diagnosis not present

## 2016-10-16 DIAGNOSIS — I4892 Unspecified atrial flutter: Secondary | ICD-10-CM | POA: Diagnosis not present

## 2016-10-16 DIAGNOSIS — J9611 Chronic respiratory failure with hypoxia: Secondary | ICD-10-CM

## 2016-10-16 DIAGNOSIS — I350 Nonrheumatic aortic (valve) stenosis: Secondary | ICD-10-CM | POA: Diagnosis not present

## 2016-10-16 DIAGNOSIS — R5383 Other fatigue: Secondary | ICD-10-CM

## 2016-10-16 DIAGNOSIS — J849 Interstitial pulmonary disease, unspecified: Secondary | ICD-10-CM

## 2016-10-16 DIAGNOSIS — D689 Coagulation defect, unspecified: Secondary | ICD-10-CM

## 2016-10-16 DIAGNOSIS — C799 Secondary malignant neoplasm of unspecified site: Secondary | ICD-10-CM

## 2016-10-16 MED ORDER — APIXABAN 5 MG PO TABS: 5.0000 mg | ORAL_TABLET | Freq: Two times a day (BID) | ORAL | 3 refills | Status: AC

## 2016-10-16 MED ORDER — METOPROLOL TARTRATE 25 MG PO TABS: 12.5000 mg | ORAL_TABLET | Freq: Two times a day (BID) | ORAL | 6 refills | Status: DC

## 2016-10-16 NOTE — Patient Instructions (Addendum)
Medication Instructions:  START Metoprolol 12.5mg  Take 1 tab by mouth twice a day START Eliquis 5mg  Take 1 tab by mouth twice a day   Labwork: Your physician recommends that you return for lab work in: COMPLETE LABS 7 days prior to Cardioversion,-CBC, TSH, BMP, PTT, INR   Testing/Procedures: Your physician has requested that you have a TEE/Cardioversion. During a TEE, sound waves are used to create images of your heart. It provides your doctor with information about the size and shape of your heart and how well your heart's chambers and valves are working. In this test, a transducer is attached to the end of a flexible tube that is guided down you throat and into your esophagus (the tube leading from your mouth to your stomach) to get a more detailed image of your heart. Once the TEE has determined that a blood clot is not present, the cardioversion begins. Electrical Cardioversion uses a jolt of electricity to your heart either through paddles or wired patches attached to your chest. This is a controlled, usually prescheduled, procedure. This procedure is done at the hospital and you are not awake during the procedure. You usually go home the day of the procedure. Please see the instruction sheet given to you today for more information.   Follow-Up: Your physician recommends that you schedule a follow-up appointment in: [place].    Any Other Special Instructions Will Be Listed Below (If Applicable).  SCHEDULE TEE/CARDIOVERSION FOR Monday OR Tuesday    If you need a refill on your cardiac medications before your next appointment, please call your pharmacy.

## 2016-10-16 NOTE — Assessment & Plan Note (Signed)
Developed in August through September 2017, treated at [facility] intensive care unit. Felt to be related to nivolimab

## 2016-10-16 NOTE — Assessment & Plan Note (Signed)
Seen today as a referral from Dr Lake Bells for atrial flutter with rapid VR

## 2016-10-16 NOTE — Assessment & Plan Note (Signed)
Mets to bone and lung

## 2016-10-16 NOTE — Assessment & Plan Note (Signed)
Dr Lake Bells will follow

## 2016-10-16 NOTE — Progress Notes (Signed)
10/16/2016 Jose Monroe   February 04, 1941  [id number]  Primary Physician Annye Asa, MD Primary Cardiologist: Dr Stanford Breed  HPI:  75 y/o male, previously followed by Dr Stanford Breed for aortic stenosis, LOV Nov 2016. At that time he had moderate to severe AS. He has been followed at [facility] since. He has had a history of renal cell cancer and apparently developed metastasis this spring. He suffered a pathologic shoulder fracture that required shoulder replacement in May. Metastasis was noted in his lung as well. He had chemotherapy over the summer but he developed ILD. He was admitted at [facility] with respiratory failure in early Sept. He and his wife had decided for him to not be intubated. He had a long hospital course- 17 days. Hospice was recommended but the pt and his wife want to pursue medical Rx to keep him as strong as possible without going to any heroic measures. He says after he was discharged he required a Hoyer lift at home but has now graduated to a wheel chair and ambulating some. He is on chronic O2. He saw Dr Lake Bells yesterday to start the process of transferring his care closer to [facility]. Dr Lake Bells noted the pt was in atrial flutter. This is new for him though he did say at times his HR was fast in the hospital, they attributed that to deconditioning. In the office today he is in atrial flutter with VR 160. He is tolerating this well.  During his hospitalization at [facility] they did an echo that showed severe AS with a pk gradient of 73 mmHg and a mean of 43 mmHg. He has not had chest pain or syncope. He seems to be stable from a pulmonary standpoint, on steroids and O2.     Current Outpatient Prescriptions  Medication Sig Dispense Refill  . acetaminophen (TYLENOL) 500 MG tablet Take 500 mg by mouth.    Marland Kitchen albuterol (PROVENTIL HFA;VENTOLIN HFA) 108 (90 Base) MCG/ACT inhaler Inhale into the lungs.    Marland Kitchen EPINEPHrine (EPIPEN 2-PAK) 0.3 mg/0.3 mL IJ SOAJ injection Use as directed    . ferrous  sulfate 325 (65 FE) MG tablet Take 325 mg by mouth daily with breakfast.    . furosemide (LASIX) 20 MG tablet Take 1 tablet (20 mg total) by mouth daily. 90 tablet 1  . Multiple Vitamin (MULTI-VITAMINS) TABS Take by mouth.    . Multiple Vitamins-Minerals (PRESERVISION/LUTEIN PO) Take by mouth.    . pantoprazole (PROTONIX) 40 MG tablet Take 1 tablet (40 mg total) by mouth daily. 90 tablet 1  . 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     Allergies  Allergen Reactions  . Nivolumab Shortness Of Breath    Pneumonitis    Social History   Social History  . Marital status: Married    Spouse name: N/A  . Number of children: 2  . Years of education: N/A   Occupational History  . Not on file.   Social History Main Topics  . Smoking status: Former Smoker    Packs/day: 1.00    Years: 25.00    Types: Cigarettes    Quit date: 10/15/2001  . Smokeless tobacco: Never Used  . Alcohol use 0.0 oz/week     Comment: 2 drinks per night  . Drug use: Unknown  . Sexual activity: Not on file  Other Topics Concern  . Not on file   Social History Narrative  . No narrative on file    FM Hx- heart disease F, lung cancer M  Review of Systems: General: negative for chills, fever, night sweats or weight changes.  Cardiovascular: negative for chest pain, edema, orthopnea, palpitations Dermatological: negative for rash Respiratory: negative for cough or wheezing Urologic: negative for hematuria Abdominal: negative for nausea, vomiting, diarrhea, bright red blood per rectum, melena, or hematemesis Neurologic: negative for visual changes, syncope, or dizziness All other systems reviewed and are otherwise negative except as noted above.    Blood pressure 116/70, pulse (!) 159, height 5\' 10"  (1.778 m), weight 159 lb (72.1  kg).  General appearance: alert, cooperative, no distress, pale and on O2, in wheel chair Neck: no JVD and transmitted murmur Lungs: decrease breath sounds, no wheezing Heart: irregularly irregular rhythm and 2/6 systolic murmur AOV Abdomen: soft, non-tender; bowel sounds normal; no masses,  no organomegaly Extremities: trace edema Pulses: 2+ and symmetric Skin: Skin color, texture, turgor normal. 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 Felt to be related to nivolimab   PLAN  Pt seen by Dr Sallyanne Kuster (DOD) and myself. We will arrange for TEE cardioversion Monday or Tuesday. In the meantime we added Metoprolol 12.5 mg BID and Eliquis 5 mg BID.   After careful review of history and examination, the risks and benefits of transesophageal echocardiogram have been explained including risks of esophageal damage, perforation (1:10,000 risk), bleeding, pharyngeal hematoma as well as other potential complications associated with conscious sedation including aspiration, arrhythmia, respiratory failure and death. Alternatives to treatment were discussed, questions were answered. Patient is willing to proceed.    Jose Pursley PA-C 10/16/2016 3:57 PM

## 2016-10-16 NOTE — Assessment & Plan Note (Signed)
Last echo 08/28/16 at [place]- EF 55%, pk 73 mmHg, mean 47 mmHg

## 2016-10-17 ENCOUNTER — Telehealth: Payer: Self-pay | Admitting: Emergency Medicine

## 2016-10-17 ENCOUNTER — Encounter: Payer: Self-pay | Admitting: Cardiology

## 2016-10-17 LAB — CBC WITH DIFFERENTIAL/PLATELET
Basophils Absolute: 0 cells/uL (ref 0–200)
Basophils Relative: 0 %
Eosinophils Absolute: 164 cells/uL (ref 15–500)
Eosinophils Relative: 1 %
HCT: 36 % — ABNORMAL LOW (ref 38.5–50.0)
Hemoglobin: 11.6 g/dL — ABNORMAL LOW (ref 13.2–17.1)
Lymphocytes Relative: 6 %
Lymphs Abs: 984 cells/uL (ref 850–3900)
MCH: 31.4 pg (ref 27.0–33.0)
MCHC: 32.2 g/dL (ref 32.0–36.0)
MCV: 97.3 fL (ref 80.0–100.0)
MPV: 8.9 fL (ref 7.5–12.5)
Monocytes Absolute: 492 cells/uL (ref 200–950)
Monocytes Relative: 3 %
Neutro Abs: 14760 cells/uL — ABNORMAL HIGH (ref 1500–7800)
Neutrophils Relative %: 90 %
Platelets: 385 10*3/uL (ref 140–400)
RBC: 3.7 MIL/uL — ABNORMAL LOW (ref 4.20–5.80)
RDW: 14.2 % (ref 11.0–15.0)
WBC: 16.4 10*3/uL — ABNORMAL HIGH (ref 3.8–10.8)

## 2016-10-17 LAB — BASIC METABOLIC PANEL
BUN: 20 mg/dL (ref 7–25)
CO2: 25 mmol/L (ref 20–31)
Calcium: 9.2 mg/dL (ref 8.6–10.3)
Chloride: 97 mmol/L — ABNORMAL LOW (ref 98–110)
Creat: 1.08 mg/dL (ref 0.70–1.18)
Glucose, Bld: 151 mg/dL — ABNORMAL HIGH (ref 65–99)
Potassium: 4.7 mmol/L (ref 3.5–5.3)
Sodium: 134 mmol/L — ABNORMAL LOW (ref 135–146)

## 2016-10-17 LAB — TSH: TSH: 1.18 mIU/L (ref 0.40–4.50)

## 2016-10-17 NOTE — Telephone Encounter (Signed)
Urvi a Physical Therapist at [facility] called advising no PT visit this week. Advised by patient wife no strenuous activity due to Atrial Flutter. Hold PT/OT this week and will resume therapy next week after Cardioversion.

## 2016-10-17 NOTE — Addendum Note (Signed)
Addended by: Cristopher Estimable on: 10/17/2016 02:51 PM   Modules accepted: Orders, SmartSet

## 2016-10-17 NOTE — Telephone Encounter (Signed)
noted 

## 2016-10-17 NOTE — Addendum Note (Signed)
Addended by: Cristopher Estimable on: 10/17/2016 02:53 PM   Modules accepted: Orders, SmartSet

## 2016-10-18 LAB — PROTIME-INR
INR: 1.1
Prothrombin Time: 11.6 s — ABNORMAL HIGH (ref 9.0–11.5)

## 2016-10-18 LAB — APTT: aPTT: 35 s — ABNORMAL HIGH (ref 22–34)

## 2016-10-21 ENCOUNTER — Ambulatory Visit (HOSPITAL_COMMUNITY): Payer: Medicare Other | Admitting: Anesthesiology

## 2016-10-21 ENCOUNTER — Encounter (HOSPITAL_COMMUNITY)
Admission: RE | Disposition: A | Discharge status: Home / Self Care | Payer: Self-pay | Source: Ambulatory Visit | Attending: Cardiology

## 2016-10-21 ENCOUNTER — Ambulatory Visit (HOSPITAL_BASED_OUTPATIENT_CLINIC_OR_DEPARTMENT_OTHER): Payer: Medicare Other

## 2016-10-21 ENCOUNTER — Other Ambulatory Visit: Payer: Self-pay | Admitting: *Deleted

## 2016-10-21 ENCOUNTER — Telehealth: Payer: Self-pay | Admitting: Cardiology

## 2016-10-21 ENCOUNTER — Ambulatory Visit (HOSPITAL_COMMUNITY)
Admission: RE | Admit: 2016-10-21 | Discharge: 2016-10-21 | Disposition: A | Discharge status: Home / Self Care | Payer: Medicare Other | Source: Ambulatory Visit | Attending: Cardiology | Admitting: Cardiology

## 2016-10-21 ENCOUNTER — Encounter (HOSPITAL_COMMUNITY): Payer: Self-pay

## 2016-10-21 DIAGNOSIS — I350 Nonrheumatic aortic (valve) stenosis: Secondary | ICD-10-CM

## 2016-10-21 DIAGNOSIS — C7951 Secondary malignant neoplasm of bone: Secondary | ICD-10-CM | POA: Insufficient documentation

## 2016-10-21 DIAGNOSIS — Z79899 Other long term (current) drug therapy: Secondary | ICD-10-CM | POA: Diagnosis not present

## 2016-10-21 DIAGNOSIS — J849 Interstitial pulmonary disease, unspecified: Secondary | ICD-10-CM | POA: Diagnosis not present

## 2016-10-21 DIAGNOSIS — C7800 Secondary malignant neoplasm of unspecified lung: Secondary | ICD-10-CM | POA: Diagnosis not present

## 2016-10-21 DIAGNOSIS — Z87891 Personal history of nicotine dependence: Secondary | ICD-10-CM | POA: Diagnosis not present

## 2016-10-21 DIAGNOSIS — C641 Malignant neoplasm of right kidney, except renal pelvis: Secondary | ICD-10-CM | POA: Insufficient documentation

## 2016-10-21 DIAGNOSIS — I4892 Unspecified atrial flutter: Secondary | ICD-10-CM | POA: Diagnosis not present

## 2016-10-21 DIAGNOSIS — Z9221 Personal history of antineoplastic chemotherapy: Secondary | ICD-10-CM | POA: Insufficient documentation

## 2016-10-21 DIAGNOSIS — Z96619 Presence of unspecified artificial shoulder joint: Secondary | ICD-10-CM | POA: Diagnosis not present

## 2016-10-21 DIAGNOSIS — Z9981 Dependence on supplemental oxygen: Secondary | ICD-10-CM | POA: Insufficient documentation

## 2016-10-21 DIAGNOSIS — C7902 Secondary malignant neoplasm of left kidney and renal pelvis: Secondary | ICD-10-CM | POA: Insufficient documentation

## 2016-10-21 DIAGNOSIS — I10 Essential (primary) hypertension: Secondary | ICD-10-CM | POA: Diagnosis not present

## 2016-10-21 DIAGNOSIS — J9611 Chronic respiratory failure with hypoxia: Secondary | ICD-10-CM | POA: Diagnosis not present

## 2016-10-21 DIAGNOSIS — Z7952 Long term (current) use of systemic steroids: Secondary | ICD-10-CM | POA: Diagnosis not present

## 2016-10-21 HISTORY — PX: TEE WITHOUT CARDIOVERSION: SHX5443

## 2016-10-21 SURGERY — ECHOCARDIOGRAM, TRANSESOPHAGEAL
Anesthesia: Monitor Anesthesia Care

## 2016-10-21 MED ORDER — BUTAMBEN-TETRACAINE-BENZOCAINE 2-2-14 % EX AERO
INHALATION_SPRAY | CUTANEOUS | Status: DC | PRN
  Administered 2016-10-21: 2 via TOPICAL

## 2016-10-21 MED ORDER — METOPROLOL TARTRATE 25 MG PO TABS: 25.0000 mg | ORAL_TABLET | Freq: Two times a day (BID) | ORAL | 6 refills | Status: DC

## 2016-10-21 MED ORDER — PROPOFOL 500 MG/50ML IV EMUL
INTRAVENOUS | Status: DC | PRN
  Administered 2016-10-21: 100 ug/kg/min via INTRAVENOUS

## 2016-10-21 MED ORDER — PROPOFOL 10 MG/ML IV BOLUS: INTRAVENOUS | Status: DC | PRN

## 2016-10-21 MED ORDER — LACTATED RINGERS IV SOLN
INTRAVENOUS | Status: DC
  Administered 2016-10-21: 11:00:00 via INTRAVENOUS
  Administered 2016-10-21: 1000 mL via INTRAVENOUS

## 2016-10-21 NOTE — Anesthesia Preprocedure Evaluation (Addendum)
Anesthesia Evaluation  Patient identified by MRN, date of birth, ID band Patient awake    Reviewed: Allergy & Precautions, NPO status , Patient's Chart, lab work & pertinent test results  Airway Mallampati: II  TM Distance: >3 FB Neck ROM: Full    Dental no notable dental hx.    Pulmonary neg pulmonary ROS, former smoker,  Pulmonary Fibrosis   Pulmonary exam normal        Cardiovascular hypertension, +CHF  Normal cardiovascular exam+ dysrhythmias Atrial Fibrillation + Valvular Problems/Murmurs AS   Normal LV size with mild LV hypertrophy. EF 60-65%. Severe   calcified, trileaflet aortic valve. Moderate (nearing severe)   aortic stenosis with mean gradient 38 mmHg and AVA 1.35 cm^2.   Normal RV size and systolic function.   Neuro/Psych negative neurological ROS  negative psych ROS   GI/Hepatic negative GI ROS, Neg liver ROS,   Endo/Other  negative endocrine ROS  Renal/GU negative Renal ROS  negative genitourinary   Musculoskeletal  (+) Arthritis ,   Abdominal   Peds negative pediatric ROS (+)  Hematology negative hematology ROS (+)   Anesthesia Other Findings Recent hospitalization for pneumonitis secondary to drug reaction to Nivolumab. Currently on 2 L 02  Reproductive/Obstetrics negative OB ROS                           Anesthesia Physical Anesthesia Plan  ASA: III  Anesthesia Plan: MAC   Post-op Pain Management:    Induction: Intravenous  Airway Management Planned: Mask  Additional Equipment:   Intra-op Plan:   Post-operative Plan:   Informed Consent: I have reviewed the patients History and Physical, chart, labs and discussed the procedure including the risks, benefits and alternatives for the proposed anesthesia with the patient or authorized representative who has indicated his/her understanding and acceptance.   Dental advisory given  Plan Discussed with:  CRNA  Anesthesia Plan Comments:         Anesthesia Quick Evaluation

## 2016-10-21 NOTE — Anesthesia Procedure Notes (Signed)
Procedure Name: MAC Date/Time: 10/21/2016 11:15 AM Performed by: Trixie Deis A Pre-anesthesia Checklist: Patient identified, Emergency Drugs available, Suction available, Patient being monitored and Timeout performed Oxygen Delivery Method: Nasal cannula Placement Confirmation: positive ETCO2

## 2016-10-21 NOTE — Anesthesia Postprocedure Evaluation (Signed)
Anesthesia Post Note  Patient: [name]  Procedure(s) Performed: Procedure(s) (LRB): TRANSESOPHAGEAL ECHOCARDIOGRAM (TEE) (N/A)  Patient location during evaluation: PACU Anesthesia Type: MAC Level of consciousness: awake and alert Pain management: pain level controlled Vital Signs Assessment: post-procedure vital signs reviewed and stable Respiratory status: spontaneous breathing, nonlabored ventilation, respiratory function stable and patient connected to nasal cannula oxygen Cardiovascular status: stable and blood pressure returned to baseline Anesthetic complications: no    Last Vitals:  Vitals:   10/21/16 1152 10/21/16 1220  BP: 106/63 120/79  Pulse: (!) 105   Resp: 12   Temp: 36.9 C     Last Pain:  Vitals:   10/21/16 1152  TempSrc: Oral                 [name]

## 2016-10-21 NOTE — Transfer of Care (Signed)
Immediate Anesthesia Transfer of Care Note  Patient: Jose Monroe  Procedure(s) Performed: Procedure(s): TRANSESOPHAGEAL ECHOCARDIOGRAM (TEE) (N/A)  Patient Location: Endoscopy Unit  Anesthesia Type:MAC  Level of Consciousness: awake, alert  and oriented  Airway & Oxygen Therapy: Patient Spontanous Breathing and Patient connected to nasal cannula oxygen  Post-op Assessment: Report given to RN, Post -op Vital signs reviewed and stable and Patient moving all extremities  Post vital signs: Reviewed and stable  Last Vitals:  Vitals:   10/21/16 1019  BP: 126/69  Pulse: (!) 110  Resp: 18  Temp: 36.4 C    Last Pain:  Vitals:   10/21/16 1019  TempSrc: Oral         Complications: No apparent anesthesia complications

## 2016-10-21 NOTE — Telephone Encounter (Signed)
Patient needs appt with Dr. Stanford Breed or his PA on Friday 11/3 for post TEE followup

## 2016-10-21 NOTE — Telephone Encounter (Signed)
PAOV Brian Crenshaw  

## 2016-10-21 NOTE — H&P (View-Only) (Signed)
10/16/2016 Jose Monroe   Mar 06, 1941  [id number]  Primary Physician Annye Asa, MD Primary Cardiologist: Dr Stanford Breed  HPI:  75 y/o male, previously followed by Dr Stanford Breed for aortic stenosis, LOV Nov 2016. At that time he had moderate to severe AS. He has been followed at [facility] since. He has had a history of renal cell cancer and apparently developed metastasis this spring. He suffered a pathologic shoulder fracture that required shoulder replacement in May. Metastasis was noted in his lung as well. He had chemotherapy over the summer but he developed ILD. He was admitted at [facility] with respiratory failure in early Sept. He and his wife had decided for him to not be intubated. He had a long hospital course- 17 days. Hospice was recommended but the pt and his wife want to pursue medical Rx to keep him as strong as possible without going to any heroic measures. He says after he was discharged he required a Hoyer lift at home but has now graduated to a wheel chair and ambulating some. He is on chronic O2. He saw Dr Lake Bells yesterday to start the process of transferring his care closer to [facility]. Dr Lake Bells noted the pt was in atrial flutter. This is new for him though he did say at times his HR was fast in the hospital, they attributed that to deconditioning. In the office today he is in atrial flutter with VR 160. He is tolerating this well.  During his hospitalization at [facility] they did an echo that showed severe AS with a pk gradient of 73 mmHg and a mean of 43 mmHg. He has not had chest pain or syncope. He seems to be stable from a pulmonary standpoint, on steroids and O2.     Current Outpatient Prescriptions  Medication Sig Dispense Refill  . acetaminophen (TYLENOL) 500 MG tablet Take 500 mg by mouth.    Marland Kitchen albuterol (PROVENTIL HFA;VENTOLIN HFA) 108 (90 Base) MCG/ACT inhaler Inhale into the lungs.    Marland Kitchen EPINEPHrine (EPIPEN 2-PAK) 0.3 mg/0.3 mL IJ SOAJ injection Use as directed    . ferrous  sulfate 325 (65 FE) MG tablet Take 325 mg by mouth daily with breakfast.    . furosemide (LASIX) 20 MG tablet Take 1 tablet (20 mg total) by mouth daily. 90 tablet 1  . Multiple Vitamin (MULTI-VITAMINS) TABS Take by mouth.    . Multiple Vitamins-Minerals (PRESERVISION/LUTEIN PO) Take by mouth.    . pantoprazole (PROTONIX) 40 MG tablet Take 1 tablet (40 mg total) by mouth daily. 90 tablet 1  . 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     Allergies  Allergen Reactions  . Nivolumab Shortness Of Breath    Pneumonitis    Social History   Social History  . Marital status: Married    Spouse name: N/A  . Number of children: 2  . Years of education: N/A   Occupational History  . Not on file.   Social History Main Topics  . Smoking status: Former Smoker    Packs/day: 1.00    Years: 25.00    Types: Cigarettes    Quit date: 10/15/2001  . Smokeless tobacco: Never Used  . Alcohol use 0.0 oz/week     Comment: 2 drinks per night  . Drug use: Unknown  . Sexual activity: Not on file  Other Topics Concern  . Not on file   Social History Narrative  . No narrative on file    FM Hx- heart disease F, lung cancer M  Review of Systems: General: negative for chills, fever, night sweats or weight changes.  Cardiovascular: negative for chest pain, edema, orthopnea, palpitations Dermatological: negative for rash Respiratory: negative for cough or wheezing Urologic: negative for hematuria Abdominal: negative for nausea, vomiting, diarrhea, bright red blood per rectum, melena, or hematemesis Neurologic: negative for visual changes, syncope, or dizziness All other systems reviewed and are otherwise negative except as noted above.    Blood pressure 116/70, pulse (!) 159, height 5\' 10"  (1.778 m), weight 159 lb (72.1  kg).  General appearance: alert, cooperative, no distress, pale and on O2, in wheel chair Neck: no JVD and transmitted murmur Lungs: decrease breath sounds, no wheezing Heart: irregularly irregular rhythm and 2/6 systolic murmur AOV Abdomen: soft, non-tender; bowel sounds normal; no masses,  no organomegaly Extremities: trace edema Pulses: 2+ and symmetric Skin: Skin color, texture, turgor normal. 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 Felt to be related to nivolimab   PLAN  Pt seen by Dr Sallyanne Kuster (DOD) and myself. We will arrange for TEE cardioversion Monday or Tuesday. In the meantime we added Metoprolol 12.5 mg BID and Eliquis 5 mg BID.   After careful review of history and examination, the risks and benefits of transesophageal echocardiogram have been explained including risks of esophageal damage, perforation (1:10,000 risk), bleeding, pharyngeal hematoma as well as other potential complications associated with conscious sedation including aspiration, arrhythmia, respiratory failure and death. Alternatives to treatment were discussed, questions were answered. Patient is willing to proceed.    Kywan Teutsch PA-C 10/16/2016 3:57 PM

## 2016-10-21 NOTE — CV Procedure (Addendum)
    PROCEDURE NOTE:  Procedure:  Transesophageal echocardiogram Operator:  Fransico Him, MD Indications:  Atrial flutter Complications: None  During this procedure the patient is administered a total of 122mg  of Propofol to achieve  sedation.  The patient's heart rate, blood pressure, and oxygen saturation are monitored continuously during the procedure by anesthesia.  Results: Normal LV size and function Normal RV size and function Normal RA Moderately dilated LA with spontaneous echo contrast.  The LA appendage is poorly seen due to shadowing from a marked prominence that prevents full visualization of the entire appendage in multiple views.  Cannot, with confidence, rule out thrombus.   Normal TV with trivial TR Normal PV Normal MV with trivial MR Severely thickened and heavily calcified trileaflet AV.  The leaflet are essentially fixed with the AVA by planimetry 0.3cm2 c/w critical AS.  There is mild to moderate AR. Marked lipomatous hypertrophy of the interatrial septum with no evidence of shunt by colorflow dopper  Moderate atherosclerotic plaque of the  thoracic and ascending aorta.  The patient tolerated the procedure well but no DCCV was performed due to inability to see the entire appendage.    The patient's HR was 106bpm at rest.  Metoprolol increased to 25mg  BID and he will follow up 11/3 int he office.  He will need 2 more weeks on Eliquis prior to DCCV.   Jose Monroe 10/21/2016, 11:25 AM

## 2016-10-21 NOTE — Interval H&P Note (Signed)
History and Physical Interval Note:  10/21/2016 11:24 AM  Jose Monroe  has presented today for surgery, with the diagnosis of AFLUTTER  The various methods of treatment have been discussed with the patient and family. After consideration of risks, benefits and other options for treatment, the patient has consented to  Procedure(s): CARDIOVERSION (N/A) TRANSESOPHAGEAL ECHOCARDIOGRAM (TEE) (N/A) as a surgical intervention .  The patient's history has been reviewed, patient examined, no change in status, stable for surgery.  I have reviewed the patient's chart and labs.  Questions were answered to the patient's satisfaction.     Fransico Him

## 2016-10-21 NOTE — Discharge Instructions (Signed)

## 2016-10-21 NOTE — Telephone Encounter (Signed)
Follow up scheduled

## 2016-10-22 ENCOUNTER — Other Ambulatory Visit: Payer: Self-pay

## 2016-10-22 ENCOUNTER — Encounter (HOSPITAL_COMMUNITY): Payer: Self-pay | Admitting: Cardiology

## 2016-10-22 LAB — HEPATIC FUNCTION PANEL
ALT: 23 U/L (ref 10–40)
AST: 23 U/L (ref 14–40)
Alkaline Phosphatase: 77 U/L (ref 25–125)
BILIRUBIN, TOTAL: 0.3 mg/dL

## 2016-10-22 LAB — CBC AND DIFFERENTIAL
HEMATOCRIT: 35 % — AB (ref 41–53)
HEMOGLOBIN: 11.3 g/dL — AB (ref 13.5–17.5)
PLATELETS: 377 10*3/uL (ref 150–399)
WBC: 15.2 10*3/mL

## 2016-10-22 LAB — BASIC METABOLIC PANEL
BUN: 20 mg/dL (ref 4–21)
Creatinine: 1.1 mg/dL (ref 0.6–1.3)
GLUCOSE: 176 mg/dL
Potassium: 5 mmol/L (ref 3.4–5.3)
SODIUM: 136 mmol/L — AB (ref 137–147)

## 2016-10-22 MED ORDER — METOPROLOL TARTRATE 25 MG PO TABS: 25.0000 mg | ORAL_TABLET | Freq: Two times a day (BID) | ORAL | 6 refills | Status: DC

## 2016-10-23 ENCOUNTER — Telehealth: Payer: Self-pay | Admitting: Family Medicine

## 2016-10-23 ENCOUNTER — Other Ambulatory Visit: Payer: Self-pay | Admitting: *Deleted

## 2016-10-23 MED ORDER — METOPROLOL TARTRATE 25 MG PO TABS: 25.0000 mg | ORAL_TABLET | Freq: Two times a day (BID) | ORAL | 12 refills | Status: DC

## 2016-10-23 NOTE — Telephone Encounter (Signed)
Don calling to get verbal order to hold occupational therapy until patient has approval from cardiologist to resume.  Call back at [phone]

## 2016-10-23 NOTE — Telephone Encounter (Signed)
Called and left a detailed message giving the verbal ok to hold OT.

## 2016-10-24 ENCOUNTER — Encounter: Payer: Self-pay | Admitting: Pulmonary Disease

## 2016-10-24 ENCOUNTER — Encounter: Payer: Self-pay | Admitting: Acute Care

## 2016-10-24 ENCOUNTER — Encounter: Payer: Self-pay | Admitting: Physician Assistant

## 2016-10-24 ENCOUNTER — Ambulatory Visit (INDEPENDENT_AMBULATORY_CARE_PROVIDER_SITE_OTHER): Payer: Medicare Other | Admitting: Physician Assistant

## 2016-10-24 ENCOUNTER — Encounter: Payer: Self-pay | Admitting: General Practice

## 2016-10-24 VITALS — BP 119/75 | HR 104 | Ht 71.5 in | Wt 164.8 lb

## 2016-10-24 DIAGNOSIS — I10 Essential (primary) hypertension: Secondary | ICD-10-CM | POA: Diagnosis not present

## 2016-10-24 DIAGNOSIS — C799 Secondary malignant neoplasm of unspecified site: Secondary | ICD-10-CM

## 2016-10-24 DIAGNOSIS — E785 Hyperlipidemia, unspecified: Secondary | ICD-10-CM

## 2016-10-24 DIAGNOSIS — I350 Nonrheumatic aortic (valve) stenosis: Secondary | ICD-10-CM

## 2016-10-24 DIAGNOSIS — I4892 Unspecified atrial flutter: Secondary | ICD-10-CM | POA: Diagnosis not present

## 2016-10-24 MED ORDER — METOPROLOL TARTRATE 25 MG PO TABS: 37.5000 mg | ORAL_TABLET | Freq: Two times a day (BID) | ORAL | 6 refills | Status: DC

## 2016-10-24 NOTE — Patient Instructions (Signed)
Medication Instructions: Increase Metoprolol to 37.5mg  (1.5tab) twice daily.   Follow-Up: Your physician recommends that you schedule a follow-up appointment on 11/06/16 with Dr. Stanford Breed (EKG)--if pt still in A flutter, will need cardioversion w/out TEE.    If you need a refill on your cardiac medications before your next appointment, please call your pharmacy.

## 2016-10-24 NOTE — Progress Notes (Signed)
Cardiology Office Note    Date:  10/24/2016   ID:  Jose Monroe, DOB 20-Apr-1941, MRN [id number]  PCP:  Annye Asa, MD  Cardiologist:  Dr. Stanford Breed  Chief Complaint  Patient presents with  . Follow-up    seen for Dr. Stanford Breed, recently diagnosed aflutter RVR, underwent TEE, however unable to do DCCV    History of Present Illness:  Jose Monroe is a 75 y.o. male with PMH of HTN, HLD, h/o moderate to severe AS, atrial flutter and metastatic renal cell carcinoma. Echocardiogram obtained on 05/03/2015 showed EF 60-65% and mild LVH, grade 1 diastolic dysfunction, moderate to severe aortic stenosis, mean gradient 38 mmHg, peak gradient 74 mmHg. he had a history of renal cell cancer and apparently developed metastasis in the bone this spring, he suffered a pathologic shoulder fracture that required shoulder displacement in May. Metastasis was noted in his lung as well. He had a chemotherapy over summer but he developed interstitial lung disease. He was admitted to [facility] with respiratory failure in September. He and his wife has decided for him not to be intubated. He had a long hospital course lasting 17 days, hospice was recommended but the patient and his wife wanted to pursue medical therapy to keep him as strong as possible without going to any heroic measures. He gets around using a wheelchair at home. He has been transitioned to follow-up with Dr. Lake Bells.  He was last seen in the office on 10/16/2016, he was in atrial flutter with ventricular rate of 160 at the time. Metoprolol tartrate 12.5 mg twice a day and eliquis 5 mg twice a day was added. He underwent a scheduled TEE DCCV on 10/31, due to inability to see entire left atrial appendage, therefore cardioversion was not pursued. TEE also showed severely thickened and heavily calcified trileaflet aortic valve with area 0.3 cm consistent with critical areas. There is also moderate to severe AR.  He presents today for cardiology  visit, he remained in atrial flutter, heart rate has improved to some degree, currently running at 104. He denies any obvious discomfort. He does have 2+ ankle edema, however no orthopnea or PND. He is currently on oxygen. He has been compliant with eliquis   Past Medical History:  Diagnosis Date  . Hyperlipidemia   . Hypertension   . Renal cell cancer ([place])    Metastatic to right humerous; resected    Past Surgical History:  Procedure Laterality Date  . arm surgery Right    tumor  . HERNIA REPAIR    . KIDNEY SURGERY Right 02/2011   kidney cancer  . TEE WITHOUT CARDIOVERSION N/A 10/21/2016   Procedure: TRANSESOPHAGEAL ECHOCARDIOGRAM (TEE);  Surgeon: Sueanne Margarita, MD;  Location: [facility] ENDOSCOPY;  Service: Cardiovascular;  Laterality: N/A;  . TONSILLECTOMY      Current Medications: Outpatient Medications Prior to Visit  Medication Sig Dispense Refill  . acetaminophen (TYLENOL) 500 MG tablet Take 500 mg by mouth.    Marland Kitchen albuterol (PROVENTIL HFA;VENTOLIN HFA) 108 (90 Base) MCG/ACT inhaler Inhale into the lungs.    Marland Kitchen apixaban (ELIQUIS) 5 MG TABS tablet Take 1 tablet (5 mg total) by mouth 2 (two) times daily. 60 tablet 3  . EPINEPHrine (EPIPEN 2-PAK) 0.3 mg/0.3 mL IJ SOAJ injection Use as directed    . furosemide (LASIX) 20 MG tablet Take 1 tablet (20 mg total) by mouth daily. 90 tablet 1  . Multiple Vitamin (MULTI-VITAMINS) TABS Take by mouth.    . Multiple Vitamins-Minerals (PRESERVISION/LUTEIN  PO) Take by mouth.    . pantoprazole (PROTONIX) 40 MG tablet Take 1 tablet (40 mg total) by mouth daily. 90 tablet 1  . 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 metoprolol tartrate (LOPRESSOR) 25 MG tablet Take 1 tablet (25 mg total) by mouth 2 (two) times daily. 60 tablet 12  . ferrous sulfate 325 (65 FE) MG tablet Take 325 mg by mouth daily with breakfast.     No  facility-administered medications prior to visit.      Allergies:   Nivolumab   Social History   Social History  . Marital status: Married    Spouse name: N/A  . Number of children: 2  . Years of education: N/A   Social History Main Topics  . Smoking status: Former Smoker    Packs/day: 1.00    Years: 25.00    Types: Cigarettes    Quit date: 10/15/2001  . Smokeless tobacco: Never Used  . Alcohol use 0.0 oz/week     Comment: 2 drinks per night  . Drug use: Unknown  . Sexual activity: Not Asked   Other Topics Concern  . None   Social History Narrative  . None     Family History:  The patient's family history includes Arthritis in his father, maternal grandmother, and mother; COPD in his father; Cancer in his mother and sister; Diabetes in his mother; Heart disease in his father and sister; Hyperlipidemia in his father, mother, and paternal grandfather.   ROS:   Please see the history of present illness.    ROS All other systems reviewed and are negative.   PHYSICAL EXAM:   VS:  BP 119/75 (BP Location: Right Arm, Patient Position: Sitting, Cuff Size: Normal)   Pulse (!) 104   Ht 5' 11.5" (1.816 m)   Wt 164 lb 12.8 oz (74.8 kg)   SpO2 97%   BMI 22.66 kg/m    GEN: Well nourished, well developed, in no acute distress  HEENT: normal  Neck: no JVD, carotid bruits, or masses Cardiac: Regularly irregular. no rubs, or gallops. 2+ pitting edema in bilateral ankle area. Loud 4/6 systolic murmur in the apex and the right upper sternal border. Respiratory: On oxygen, decreased breath sound in left lower lobe with fine crackles, also has fine crackles in the right lower lobe as well. GI: soft, nontender, nondistended, + BS MS: no deformity or atrophy  Skin: warm and dry, no rash Neuro:  Alert and Oriented x 3, Strength and sensation are intact Psych: euthymic mood, full affect  Wt Readings from Last 3 Encounters:  10/24/16 164 lb 12.8 oz (74.8 kg)  10/21/16 158 lb (71.7 kg)   10/16/16 159 lb (72.1 kg)      Studies/Labs Reviewed:   EKG:  EKG is ordered today.  The ekg ordered today demonstrates Atrial flutter with heart rate of 104.  Recent Labs: 10/16/2016: TSH 1.18 10/22/2016: ALT 23; BUN 20; Creatinine 1.1; Hemoglobin 11.3; Platelets 377; Potassium 5.0; Sodium 136   Lipid Panel    Component Value Date/Time   CHOL 97 (L) 08/06/2016 1323   TRIG 125 08/06/2016 1323   HDL 25 (L) 08/06/2016 1323   CHOLHDL 3.9 08/06/2016 1323   VLDL 25 08/06/2016 1323   LDLCALC 47 08/06/2016 1323    Additional studies/ records that were reviewed today include:   Echo 05/03/2015 LV EF: 60% -  65%  ------------------------------------------------------------------- Indications:      Aortic Valve Disease (I35.9).  ------------------------------------------------------------------- History:   PMH:   Dyspnea.  Aortic valve disease.  Risk factors: Renal Cell Carcinoma (Right Nephrectomy) with Metastasis to Right Humerus. Status Post Radiation Therapy Family history of coronary artery disease. Former tobacco use. Hypertension. Dyslipidemia.  ------------------------------------------------------------------- Study Conclusions  - Left ventricle: The cavity size was normal. Wall thickness was   increased in a pattern of mild LVH. Systolic function was normal.   The estimated ejection fraction was in the range of 60% to 65%.   Wall motion was normal; there were no regional wall motion   abnormalities. Doppler parameters are consistent with abnormal   left ventricular relaxation (grade 1 diastolic dysfunction). - Aortic valve: Trileaflet; severely calcified leaflets. There was   trivial regurgitation. There was moderate, bordering on severe,   aortic stenosis. Mean gradient (S): 38 mm Hg. Peak gradient (S):   74 mm Hg. Valve area (VTI): 1.35 cm^2. - Mitral valve: Mildly calcified annulus. Mildly calcified leaflets   . There was no significant regurgitation. -  Left atrium: The atrium was mildly dilated. - Right ventricle: The cavity size was normal. Systolic function   was normal. - Pulmonary arteries: No complete TR doppler jet so unable to   estimate PA systolic pressure. - Systemic veins: IVC was not visualized.  Impressions:  - Normal LV size with mild LV hypertrophy. EF 60-65%. Severe   calcified, trileaflet aortic valve. Moderate (nearing severe)   aortic stenosis with mean gradient 38 mmHg and AVA 1.35 cm^2.   Normal RV size and systolic function.   TEE 10/21/2016 Results: Normal LV size and function Normal RV size and function Normal RA Moderately dilated LA with spontaneous echo contrast.  The LA appendage is poorly seen due to shadowing from a marked prominence that prevents full visualization of the entire appendage in multiple views.  Cannot, with confidence, rule out thrombus.   Normal TV with trivial TR Normal PV Normal MV with trivial MR Severely thickened and heavily calcified trileaflet AV.  The leaflet are essentially fixed with the AVA by planimetry 0.3cm2 c/w critical AS.  There is mild to moderate AR. Marked lipomatous hypertrophy of the interatrial septum with no evidence of shunt by colorflow dopper  Moderate atherosclerotic plaque of the  thoracic and ascending aorta.  The patient tolerated the procedure well but no DCCV was performed due to inability to see the entire appendage.     ASSESSMENT:    1. Atrial flutter, unspecified type ([place])   2. Essential hypertension   3. Severe aortic stenosis   4. Hyperlipidemia, unspecified hyperlipidemia type   5. Metastatic cancer ([place])      PLAN:  In order of problems listed above:  1. Paroxysmal atrial flutter with RVR - Started on eliquis on 10/16/2016, had a transesophageal echocardiogram on 10/31, unable to have cardioversion due to the inability to see inside left atrial appendage. His current heart rate is uncontrolled still in the 100 to 110s range. - I  plan to increase his metoprolol to 37.5 mg twice a day. He will return in 2 weeks, if his heart rate is better controlled, we'll plan to schedule for outpatient cardioversion. He does have 2+ ankle edema, I have advised him to raise the bed by putting 2 pillow under his legs for now, he does not appears to have fluid in his lung that this time. I think part of the reason is related to recent tachycardia  which showed improvement after cardioversion. He has been advised to call cardiology if swelling does worsen.  2. Critical aortic stenosis: Previously noted to have moderately severe aortic stenosis, however recent transesophageal echocardiogram confirmed critical AS. Due to his metastatic renal cell carcinoma, he is likely not a candidate for traditional surgery. Also he has been dependent on home oxygen, therefore major surgery is very unlikely in this case. We will have him a little up with Dr. Stanford Breed who can review the recent transesophageal echocardiogram. Not sure if he is at TAVR candidate in light of his breathing issue and metastatic renal cell carcinoma. Patient has expressed the idea that he does not wish to be intubated for any procedure which I agree with him.  3. Metastatic renal cell carcinoma followed by [facility] hematology/oncology  4. Hypertension: Blood pressure well controlled at this time    Medication Adjustments/Labs and Tests Ordered: Current medicines are reviewed at length with the patient today.  Concerns regarding medicines are outlined above.  Medication changes, Labs and Tests ordered today are listed in the Patient Instructions below. Patient Instructions  Medication Instructions: Increase Metoprolol to 37.5mg  (1.5tab) twice daily.   Follow-Up: Your physician recommends that you schedule a follow-up appointment on 11/06/16 with Dr. Stanford Breed (EKG)--if pt still in A flutter, will need cardioversion w/out TEE.    If you need a refill on your cardiac medications  before your next appointment, please call your pharmacy.     Hilbert Corrigan, [place]  10/24/2016 6:30 PM    [facility] [address], [place], [place]  [address] Phone: [phone]; Fax: [phone]

## 2016-10-24 NOTE — Telephone Encounter (Signed)
FYI to BQ -   Per pt he is now taking 30mg  BID of prednisone.

## 2016-10-26 ENCOUNTER — Telehealth: Payer: Self-pay | Admitting: Student

## 2016-10-26 NOTE — Telephone Encounter (Signed)
  Patient called reporting worsening insomnia for the past 3 days. Recent Lopressor dosing increase. I informed him this would be an uncommon side-effect of the medication, as it usually has the opposite effect and causes worsening fatigue.   I advised him to try OTC Melatonin or Benadryl 30 minutes prior to bedtime. Reviewed healthy sleep habits. Patient aware that an Rx sleep aid would need to come from PCP.  Patient aware of recommendations and appreciative of the call.  Signed, Erma Heritage, PA-C 10/26/2016, 12:12 PM Pager: [phone]

## 2016-10-27 ENCOUNTER — Encounter: Payer: Self-pay | Admitting: Pulmonary Disease

## 2016-10-27 ENCOUNTER — Encounter: Payer: Self-pay | Admitting: Physician Assistant

## 2016-10-27 NOTE — Telephone Encounter (Signed)
Called the patient, he says he has SOB with minimal exertion and also episodic SOB at rest as well. He has not been sleeping very well despite being tired all the time. He has nonproductive cough, but a few days ago, he had pink tinged sputum which he thought it was blood. CBC 5 days ago showed stable hemoglobin which was mildly anemic.   He is on 20mg  daily lasix. He still has the same ankle edema as when he saw me. I had advised him to take extra dose of 20mg  lasix for today before going back to 20mg  lasix daily. I am hesitant to increase lasix permanently as he has critical AS and would be very symptomatic with decreased preload. I am also hesitant to prescribe any Karlyn Agee which can further decrease his respiratory drive.   I will call him back again on Wednesday to check on his progress.  Hilbert Corrigan PA Pager: [phone]

## 2016-10-28 ENCOUNTER — Other Ambulatory Visit: Payer: Self-pay

## 2016-10-28 MED ORDER — METOPROLOL TARTRATE 25 MG PO TABS: 37.5000 mg | ORAL_TABLET | Freq: Two times a day (BID) | ORAL | 6 refills | Status: DC

## 2016-10-29 ENCOUNTER — Other Ambulatory Visit: Payer: Self-pay | Admitting: *Deleted

## 2016-10-31 ENCOUNTER — Telehealth: Payer: Self-pay | Admitting: Cardiology

## 2016-10-31 ENCOUNTER — Other Ambulatory Visit: Payer: Self-pay | Admitting: Physician Assistant

## 2016-10-31 ENCOUNTER — Inpatient Hospital Stay (HOSPITAL_COMMUNITY): Payer: Medicare Other

## 2016-10-31 ENCOUNTER — Encounter: Payer: Self-pay | Admitting: Physician Assistant

## 2016-10-31 ENCOUNTER — Inpatient Hospital Stay (HOSPITAL_COMMUNITY)
Admission: AD | Admit: 2016-10-31 | Discharge: 2016-11-21 | DRG: 291 | Disposition: E | Payer: Medicare Other | Source: Ambulatory Visit | Attending: Cardiology | Admitting: Cardiology

## 2016-10-31 ENCOUNTER — Ambulatory Visit (INDEPENDENT_AMBULATORY_CARE_PROVIDER_SITE_OTHER): Payer: Self-pay | Admitting: Physician Assistant

## 2016-10-31 ENCOUNTER — Telehealth: Payer: Self-pay | Admitting: Physician Assistant

## 2016-10-31 VITALS — BP 125/78 | HR 148 | Ht 71.0 in | Wt 172.4 lb

## 2016-10-31 DIAGNOSIS — Z809 Family history of malignant neoplasm, unspecified: Secondary | ICD-10-CM

## 2016-10-31 DIAGNOSIS — Z515 Encounter for palliative care: Secondary | ICD-10-CM | POA: Diagnosis not present

## 2016-10-31 DIAGNOSIS — I4892 Unspecified atrial flutter: Secondary | ICD-10-CM | POA: Diagnosis present

## 2016-10-31 DIAGNOSIS — N170 Acute kidney failure with tubular necrosis: Secondary | ICD-10-CM | POA: Diagnosis present

## 2016-10-31 DIAGNOSIS — J810 Acute pulmonary edema: Secondary | ICD-10-CM | POA: Diagnosis not present

## 2016-10-31 DIAGNOSIS — Z7901 Long term (current) use of anticoagulants: Secondary | ICD-10-CM | POA: Diagnosis not present

## 2016-10-31 DIAGNOSIS — Z9221 Personal history of antineoplastic chemotherapy: Secondary | ICD-10-CM | POA: Diagnosis not present

## 2016-10-31 DIAGNOSIS — I110 Hypertensive heart disease with heart failure: Principal | ICD-10-CM | POA: Diagnosis present

## 2016-10-31 DIAGNOSIS — Z825 Family history of asthma and other chronic lower respiratory diseases: Secondary | ICD-10-CM

## 2016-10-31 DIAGNOSIS — R34 Anuria and oliguria: Secondary | ICD-10-CM | POA: Diagnosis present

## 2016-10-31 DIAGNOSIS — E785 Hyperlipidemia, unspecified: Secondary | ICD-10-CM | POA: Diagnosis present

## 2016-10-31 DIAGNOSIS — Z66 Do not resuscitate: Secondary | ICD-10-CM | POA: Diagnosis present

## 2016-10-31 DIAGNOSIS — Z87891 Personal history of nicotine dependence: Secondary | ICD-10-CM

## 2016-10-31 DIAGNOSIS — N179 Acute kidney failure, unspecified: Secondary | ICD-10-CM | POA: Diagnosis present

## 2016-10-31 DIAGNOSIS — E875 Hyperkalemia: Secondary | ICD-10-CM | POA: Diagnosis present

## 2016-10-31 DIAGNOSIS — R570 Cardiogenic shock: Secondary | ICD-10-CM | POA: Diagnosis present

## 2016-10-31 DIAGNOSIS — I350 Nonrheumatic aortic (valve) stenosis: Secondary | ICD-10-CM

## 2016-10-31 DIAGNOSIS — C799 Secondary malignant neoplasm of unspecified site: Secondary | ICD-10-CM

## 2016-10-31 DIAGNOSIS — E872 Acidosis: Secondary | ICD-10-CM | POA: Diagnosis present

## 2016-10-31 DIAGNOSIS — R0603 Acute respiratory distress: Secondary | ICD-10-CM | POA: Diagnosis present

## 2016-10-31 DIAGNOSIS — J849 Interstitial pulmonary disease, unspecified: Secondary | ICD-10-CM | POA: Diagnosis present

## 2016-10-31 DIAGNOSIS — C7951 Secondary malignant neoplasm of bone: Secondary | ICD-10-CM | POA: Diagnosis present

## 2016-10-31 DIAGNOSIS — I5033 Acute on chronic diastolic (congestive) heart failure: Secondary | ICD-10-CM

## 2016-10-31 DIAGNOSIS — Z7952 Long term (current) use of systemic steroids: Secondary | ICD-10-CM

## 2016-10-31 DIAGNOSIS — Z79899 Other long term (current) drug therapy: Secondary | ICD-10-CM | POA: Diagnosis not present

## 2016-10-31 DIAGNOSIS — E871 Hypo-osmolality and hyponatremia: Secondary | ICD-10-CM | POA: Diagnosis present

## 2016-10-31 DIAGNOSIS — C7800 Secondary malignant neoplasm of unspecified lung: Secondary | ICD-10-CM | POA: Diagnosis present

## 2016-10-31 DIAGNOSIS — T451X5A Adverse effect of antineoplastic and immunosuppressive drugs, initial encounter: Secondary | ICD-10-CM | POA: Diagnosis present

## 2016-10-31 DIAGNOSIS — J9601 Acute respiratory failure with hypoxia: Secondary | ICD-10-CM | POA: Diagnosis not present

## 2016-10-31 DIAGNOSIS — C641 Malignant neoplasm of right kidney, except renal pelvis: Secondary | ICD-10-CM | POA: Diagnosis present

## 2016-10-31 DIAGNOSIS — I10 Essential (primary) hypertension: Secondary | ICD-10-CM | POA: Diagnosis present

## 2016-10-31 DIAGNOSIS — Z833 Family history of diabetes mellitus: Secondary | ICD-10-CM

## 2016-10-31 DIAGNOSIS — Z923 Personal history of irradiation: Secondary | ICD-10-CM

## 2016-10-31 DIAGNOSIS — I351 Nonrheumatic aortic (valve) insufficiency: Secondary | ICD-10-CM | POA: Diagnosis present

## 2016-10-31 DIAGNOSIS — Z452 Encounter for adjustment and management of vascular access device: Secondary | ICD-10-CM

## 2016-10-31 DIAGNOSIS — Z8249 Family history of ischemic heart disease and other diseases of the circulatory system: Secondary | ICD-10-CM

## 2016-10-31 LAB — COMPREHENSIVE METABOLIC PANEL
ALBUMIN: 2.5 g/dL — AB (ref 3.5–5.0)
ALK PHOS: 120 U/L (ref 38–126)
ALT: 153 U/L — ABNORMAL HIGH (ref 17–63)
ANION GAP: 11 (ref 5–15)
AST: 83 U/L — ABNORMAL HIGH (ref 15–41)
BUN: 26 mg/dL — ABNORMAL HIGH (ref 6–20)
CHLORIDE: 94 mmol/L — AB (ref 101–111)
CO2: 25 mmol/L (ref 22–32)
Calcium: 9 mg/dL (ref 8.9–10.3)
Creatinine, Ser: 1.33 mg/dL — ABNORMAL HIGH (ref 0.61–1.24)
GFR calc non Af Amer: 50 mL/min — ABNORMAL LOW (ref >60)
GFR, EST AFRICAN AMERICAN: 58 mL/min — AB (ref >60)
GLUCOSE: 222 mg/dL — AB (ref 65–99)
POTASSIUM: 5.1 mmol/L (ref 3.5–5.1)
SODIUM: 130 mmol/L — AB (ref 135–145)
Total Bilirubin: 0.5 mg/dL (ref 0.3–1.2)
Total Protein: 6.7 g/dL (ref 6.5–8.1)

## 2016-10-31 LAB — CBC WITH DIFFERENTIAL/PLATELET
BASOS PCT: 0 %
Basophils Absolute: 0 10*3/uL (ref 0.0–0.1)
EOS ABS: 0 10*3/uL (ref 0.0–0.7)
EOS PCT: 0 %
HCT: 36.6 % — ABNORMAL LOW (ref 39.0–52.0)
HEMOGLOBIN: 11.6 g/dL — AB (ref 13.0–17.0)
LYMPHS ABS: 1.6 10*3/uL (ref 0.7–4.0)
Lymphocytes Relative: 16 %
MCH: 30.4 pg (ref 26.0–34.0)
MCHC: 31.7 g/dL (ref 30.0–36.0)
MCV: 96.1 fL (ref 78.0–100.0)
MONOS PCT: 5 %
Monocytes Absolute: 0.5 10*3/uL (ref 0.1–1.0)
NEUTROS PCT: 79 %
Neutro Abs: 7.9 10*3/uL — ABNORMAL HIGH (ref 1.7–7.7)
PLATELETS: 417 10*3/uL — AB (ref 150–400)
RBC: 3.81 MIL/uL — ABNORMAL LOW (ref 4.22–5.81)
RDW: 14.1 % (ref 11.5–15.5)
WBC: 10.1 10*3/uL (ref 4.0–10.5)

## 2016-10-31 LAB — TSH: TSH: 1.656 u[IU]/mL (ref 0.350–4.500)

## 2016-10-31 MED ORDER — APIXABAN 5 MG PO TABS
5.0000 mg | ORAL_TABLET | Freq: Two times a day (BID) | ORAL | Status: DC
  Administered 2016-10-31 – 2016-11-01 (×3): 5 mg via ORAL
  Filled 2016-10-31 (×3): qty 1

## 2016-10-31 MED ORDER — ONDANSETRON HCL 4 MG/2ML IJ SOLN
4.0000 mg | Freq: Four times a day (QID) | INTRAMUSCULAR | Status: DC | PRN
  Administered 2016-11-01 – 2016-11-02 (×2): 4 mg via INTRAVENOUS
  Filled 2016-10-31 (×2): qty 2

## 2016-10-31 MED ORDER — FUROSEMIDE 10 MG/ML IJ SOLN
40.0000 mg | INTRAMUSCULAR | Status: AC
  Administered 2016-11-01: 40 mg via INTRAVENOUS

## 2016-10-31 MED ORDER — FUROSEMIDE 10 MG/ML IJ SOLN
INTRAMUSCULAR | Status: AC
  Filled 2016-10-31: qty 4

## 2016-10-31 MED ORDER — FUROSEMIDE 10 MG/ML IJ SOLN: 40.0000 mg | Freq: Once | INTRAMUSCULAR | Status: DC

## 2016-10-31 MED ORDER — METOPROLOL TARTRATE 25 MG PO TABS: 37.5000 mg | ORAL_TABLET | Freq: Two times a day (BID) | ORAL | 6 refills | Status: AC

## 2016-10-31 MED ORDER — FUROSEMIDE 10 MG/ML IJ SOLN: 40.0000 mg | Freq: Two times a day (BID) | INTRAMUSCULAR | Status: DC

## 2016-10-31 MED ORDER — METOPROLOL TARTRATE 50 MG PO TABS
50.0000 mg | ORAL_TABLET | Freq: Two times a day (BID) | ORAL | Status: DC
  Administered 2016-10-31 – 2016-11-01 (×2): 50 mg via ORAL
  Filled 2016-10-31 (×2): qty 1

## 2016-10-31 MED ORDER — PANTOPRAZOLE SODIUM 40 MG PO TBEC
40.0000 mg | DELAYED_RELEASE_TABLET | Freq: Every day | ORAL | Status: DC
  Administered 2016-11-01 – 2016-11-02 (×2): 40 mg via ORAL
  Filled 2016-10-31 (×2): qty 1

## 2016-10-31 MED ORDER — PREDNISONE 50 MG PO TABS
25.0000 mg | ORAL_TABLET | Freq: Every day | ORAL | Status: DC
  Administered 2016-11-01: 25 mg via ORAL
  Filled 2016-10-31: qty 1

## 2016-10-31 MED ORDER — ALBUTEROL SULFATE (2.5 MG/3ML) 0.083% IN NEBU
3.0000 mL | INHALATION_SOLUTION | Freq: Four times a day (QID) | RESPIRATORY_TRACT | Status: DC | PRN
  Administered 2016-10-31: 3 mL via RESPIRATORY_TRACT
  Filled 2016-10-31: qty 3

## 2016-10-31 MED ORDER — AMIODARONE HCL IN DEXTROSE 360-4.14 MG/200ML-% IV SOLN
60.0000 mg/h | INTRAVENOUS | Status: AC
  Administered 2016-10-31 (×2): 60 mg/h via INTRAVENOUS
  Filled 2016-10-31 (×2): qty 200

## 2016-10-31 MED ORDER — FUROSEMIDE 10 MG/ML IJ SOLN
40.0000 mg | Freq: Two times a day (BID) | INTRAMUSCULAR | Status: DC
  Administered 2016-10-31 – 2016-11-01 (×2): 40 mg via INTRAVENOUS
  Filled 2016-10-31 (×2): qty 4

## 2016-10-31 MED ORDER — AMIODARONE HCL IN DEXTROSE 360-4.14 MG/200ML-% IV SOLN
30.0000 mg/h | INTRAVENOUS | Status: DC
  Administered 2016-10-31 – 2016-11-01 (×2): 30 mg/h via INTRAVENOUS
  Filled 2016-10-31: qty 200

## 2016-10-31 MED ORDER — AMIODARONE LOAD VIA INFUSION
150.0000 mg | Freq: Once | INTRAVENOUS | Status: AC
  Administered 2016-10-31: 150 mg via INTRAVENOUS
  Filled 2016-10-31: qty 83.34

## 2016-10-31 NOTE — Progress Notes (Signed)
Cardiology Office Note    Date:  11/15/2016   ID:  Jose Monroe, DOB 1941/10/04, MRN [id number]  PCP:  Annye Asa, MD  Cardiologist:  Dr. Stanford Breed  Chief Complaint  Patient presents with  . Follow-up    seen for Dr. Stanford Breed, acute on chronic diastolic heart failure    History of Present Illness:  Jose Monroe is a 75 y.o. male with PMH of HTN, HLD, h/o moderate to severe AS, atrial flutter and metastatic renal cell carcinoma. Echocardiogram obtained on 05/03/2015 showed EF 60-65% and mild LVH, grade 1 diastolic dysfunction, moderate to severe aortic stenosis, mean gradient 38 mmHg, peak gradient 74 mmHg. he had a history of renal cell cancer and apparently developed metastasis in the bone this spring, he suffered a pathologic shoulder fracture that required shoulder displacement in May. Metastasis was noted in his lung as well. He had a chemotherapy over summer but he developed interstitial lung disease. He was admitted to [facility] with respiratory failure in September. He and his wife has decided for him not to be intubated. He had a long hospital course lasting 17 days, hospice was recommended but the patient and his wife wanted to pursue medical therapy to keep him as strong as possible without going to any heroic measures. He gets around using a wheelchair at home. He has been transitioned to follow-up with Dr. Lake Bells.  He was last seen in the office on 10/16/2016, he was in atrial flutter with ventricular rate of 160 at the time. Metoprolol tartrate 12.5 mg twice a day and eliquis 5 mg twice a day was added. He underwent a scheduled TEE DCCV on 10/31, due to inability to see entire left atrial appendage, therefore cardioversion was not pursued. TEE also showed severely thickened and heavily calcified trileaflet aortic valve with area 0.3 cm consistent with critical areas. There is also moderate to severe AR.  I saw the patient on 10/24/2016, patient was compliant with the eliquis  at the time. He did have 2+ ankle edema, but no pretibial edema. His lung was clear at the time. Due to critical areas, I was hesitant to aggressively diurese him, I did ask him to take an additional dose of Lasix on that day. I called the patient on Monday 11/6, he was still having shortness of breath that unchanged, so I instructed the patient to take an additional dose of Lasix. I followed the patient up with another phone call on Wednesday 10/29/2016, he was stable at the time however beginning to notice some increased swelling. He took additional dose of Lasix this morning, he has not noticed any increase in the urine output. As far as his heart rate, I increased his metoprolol 37.5 mg twice a day, during our conversation on follow-up phone calls, he says typically his heart rate is in the 140s immediately after waking up, however as he rested a bit, his heart rate does go down to 100. He was urgent add-on today, his lower extremity edema has worsened since I saw him last week, despite the fact that he has taken several doses of increased Lasix. I think he needs to be aggressively diuresed, however I am also concerned about his critical aortic stenosis. He has increased his oxygen to 4 L/m from 2 L/m. His O2 saturation in the clinic is stable in the upper 90s. I have discussed with Dr. Stanford Breed as I am not confident we can manage his heart rate or his volume status as outpatient. We plan  to start IV amiodarone and and do inpatient DC cardioversion, he understand the increased risk of stroke if cardioversion has to be done prior to the 3 weeks of anticoagulation. Note apixaban was started on 10/26.   We had a long discussion with the patient, he is DO NOT RESUSCITATE at this time, he does not want any intubation, resuscitation, CPR, or shock for any ventricular rhythm. He is okay to receive cardioversion for his atrial flutter though.    Past Medical History:  Diagnosis Date  . Hyperlipidemia   .  Hypertension   . Renal cell cancer ([place])    Metastatic to right humerous; resected    Past Surgical History:  Procedure Laterality Date  . arm surgery Right    tumor  . HERNIA REPAIR    . KIDNEY SURGERY Right 02/2011   kidney cancer  . TEE WITHOUT CARDIOVERSION N/A 10/21/2016   Procedure: TRANSESOPHAGEAL ECHOCARDIOGRAM (TEE);  Surgeon: Sueanne Margarita, MD;  Location: [facility] ENDOSCOPY;  Service: Cardiovascular;  Laterality: N/A;  . TONSILLECTOMY      Current Medications: Outpatient Medications Prior to Visit  Medication Sig Dispense Refill  . acetaminophen (TYLENOL) 500 MG tablet Take 500 mg by mouth.    Marland Kitchen albuterol (PROVENTIL HFA;VENTOLIN HFA) 108 (90 Base) MCG/ACT inhaler Inhale into the lungs.    Marland Kitchen apixaban (ELIQUIS) 5 MG TABS tablet Take 1 tablet (5 mg total) by mouth 2 (two) times daily. 60 tablet 3  . EPINEPHrine (EPIPEN 2-PAK) 0.3 mg/0.3 mL IJ SOAJ injection Use as directed    . furosemide (LASIX) 20 MG tablet Take 1 tablet (20 mg total) by mouth daily. 90 tablet 1  . metoprolol tartrate (LOPRESSOR) 25 MG tablet Take 1.5 tablets (37.5 mg total) by mouth 2 (two) times daily. 90 tablet 6  . Multiple Vitamin (MULTI-VITAMINS) TABS Take by mouth.    . Multiple Vitamins-Minerals (PRESERVISION/LUTEIN PO) Take by mouth.    . pantoprazole (PROTONIX) 40 MG tablet Take 1 tablet (40 mg total) by mouth daily. 90 tablet 1  . 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      Allergies:   Nivolumab   Social History   Social History  . Marital status: Married    Spouse name: N/A  . Number of children: 2  . Years of education: N/A   Social History Main Topics  . Smoking status: Former Smoker    Packs/day: 1.00    Years: 25.00    Types: Cigarettes    Quit date: 10/15/2001  . Smokeless tobacco: Never Used  . Alcohol use 0.0  oz/week     Comment: 2 drinks per night  . Drug use: Unknown  . Sexual activity: Not Asked   Other Topics Concern  . None   Social History Narrative  . None     Family History:  The patient's family history includes Arthritis in his father, maternal grandmother, and mother; COPD in his father; Cancer in his mother and sister; Diabetes in his mother; Heart disease in his father and sister; Hyperlipidemia in his father, mother, and paternal grandfather.   ROS:   Please see the history of present illness.    ROS All other systems reviewed and are negative.   PHYSICAL EXAM:   VS:  BP 125/78   Pulse (!) 148   Ht 5\' 11"  (1.803  m)   Wt 172 lb 6.4 oz (78.2 kg)   BMI 24.04 kg/m    GEN: Well nourished, well developed, in no acute distress  HEENT: normal  Neck: no JVD, carotid bruits, or masses Cardiac: Tachycardic, irregular; no rubs, or gallops,no edema  3+ pitting edema in bilateral LE. [id number] systolic murmurs in the apex and the right upper sternal border. Respiratory:  clear to auscultation bilaterally, normal work of breathing. Bibasilar crackle, noticeable decreased breath sound in the right lower extremity. GI: soft, nontender, nondistended, + BS MS: no deformity or atrophy  Skin: warm and dry, no rash Neuro:  Alert and Oriented x 3, Strength and sensation are intact Psych: euthymic mood, full affect  Wt Readings from Last 3 Encounters:  11/08/2016 172 lb 6.4 oz (78.2 kg)  10/24/16 164 lb 12.8 oz (74.8 kg)  10/21/16 158 lb (71.7 kg)      Studies/Labs Reviewed:   EKG:  EKG is not ordered today.    Recent Labs: 10/16/2016: TSH 1.18 10/22/2016: ALT 23; BUN 20; Creatinine 1.1; Hemoglobin 11.3; Platelets 377; Potassium 5.0; Sodium 136   Lipid Panel    Component Value Date/Time   CHOL 97 (L) 08/06/2016 1323   TRIG 125 08/06/2016 1323   HDL 25 (L) 08/06/2016 1323   CHOLHDL 3.9 08/06/2016 1323   VLDL 25 08/06/2016 1323   LDLCALC 47 08/06/2016 1323    Additional studies/  records that were reviewed today include:  Echo 05/03/2015 LV EF: 60% - 65%  ------------------------------------------------------------------- Indications: Aortic Valve Disease (I35.9).  ------------------------------------------------------------------- History: PMH: Dyspnea. Aortic valve disease. Risk factors: Renal Cell Carcinoma (Right Nephrectomy) with Metastasis to Right Humerus. Status Post Radiation Therapy Family history of coronary artery disease. Former tobacco use. Hypertension. Dyslipidemia.  ------------------------------------------------------------------- Study Conclusions  - Left ventricle: The cavity size was normal. Wall thickness was increased in a pattern of mild LVH. Systolic function was normal. The estimated ejection fraction was in the range of 60% to 65%. Wall motion was normal; there were no regional wall motion abnormalities. Doppler parameters are consistent with abnormal left ventricular relaxation (grade 1 diastolic dysfunction). - Aortic valve: Trileaflet; severely calcified leaflets. There was trivial regurgitation. There was moderate, bordering on severe, aortic stenosis. Mean gradient (S): 38 mm Hg. Peak gradient (S): 74 mm Hg. Valve area (VTI): 1.35 cm^2. - Mitral valve: Mildly calcified annulus. Mildly calcified leaflets . There was no significant regurgitation. - Left atrium: The atrium was mildly dilated. - Right ventricle: The cavity size was normal. Systolic function was normal. - Pulmonary arteries: No complete TR doppler jet so unable to estimate PA systolic pressure. - Systemic veins: IVC was not visualized.  Impressions:  - Normal LV size with mild LV hypertrophy. EF 60-65%. Severe calcified, trileaflet aortic valve. Moderate (nearing severe) aortic stenosis with mean gradient 38 mmHg and AVA 1.35 cm^2. Normal RV size and systolic function.   TEE 10/21/2016 Results: Normal LV  size and function Normal RV size and function Normal RA Moderately dilated LA with spontaneous echo contrast. The LA appendage is poorly seen due to shadowing from a marked prominence that prevents full visualization of the entire appendage in multiple views. Cannot, with confidence, rule out thrombus.  Normal TV with trivial TR Normal PV Normal MV with trivial MR Severely thickened and heavily calcified trileaflet AV. The leaflet are essentially fixed with the AVA by planimetry 0.3cm2 c/w critical AS. There is mild to moderate AR. Marked lipomatous hypertrophy of theinteratrial septum with no evidence of shunt by  colorflow dopper  Moderate atherosclerotic plaque of the thoracic and ascending aorta.  The patient tolerated the procedure well but no DCCV was performed due to inability to see the entire appendage.      ASSESSMENT:    1. Atrial flutter with rapid ventricular response ([place])   2. Acute on chronic diastolic heart failure ([place])   3. Severe aortic stenosis   4. Essential hypertension   5. Hyperlipidemia, unspecified hyperlipidemia type   6. Metastatic cancer ([place])      PLAN:  In order of problems listed above:  1. Atrial flutter with RVR: I increased his metoprolol to 37.5 mg twice a day, his heart rate remained uncontrolled, now staying in the 140s range. Although I can keep up titrating the beta blocker, I am not confident we can control his heart rate very well as outpatient. We have attempted TEE on 10/21/2016, however cardioversion was unable to be done as left atrial appendage was unable to be visualized. With plan to admit to the hospital today, continue on metoprolol up titrating the dose to 50 mg twice a day. Start on IV amiodarone.  2. Acute on chronic diastolic heart failure: He has remarkably gained roughly 8 pounds in the last 7 days despite increased Lasix for 3 days. He has not noticed significant urinary output. I think he will require IV Lasix,  however given the critical areas, he would be very prone to reduction, therefore diuresis will have to be careful.  3. Severe aortic stenosis: Although previous transesophageal echo on 05/03/2015 which showed moderate bordering on severe aortic stenosis. However recent transesophageal echocardiogram showed severe critical aortic stenosis with valve area 0.3 m.  4. Metastatic renal cell carcinoma: He has metastasis to the lung and to the bones.  5. Hypertension: Blood pressure stable after increasing metoprolol tartrate to 37.5 during the last visit.  6. Hyperlipidemia: Currently not on a statin medication, we'll reassess fasting lipid panel at a later time.  Of note, patient is a DO NOT RESUSCITATE.  Medication Adjustments/Labs and Tests Ordered: Current medicines are reviewed at length with the patient today.  Concerns regarding medicines are outlined above.  Medication changes, Labs and Tests ordered today are listed in the Patient Instructions below. There are no Patient Instructions on file for this visit.   Hilbert Corrigan, [place]  10/24/2016 3:26 PM    [facility] [address], [place], [place]  [address] Phone: [phone]; Fax: [phone]

## 2016-10-31 NOTE — Telephone Encounter (Signed)
Spoke with patient and he is having shortness of breath and extreme fatigue to the point when he walks across the room it takes about 30 min to recover Swelling in legs, took 2 Furosemide this am and no increased urinary output, may even be less Discussed with Estrella Myrtle PA and he will see patient today if Dr Stanford Breed has no availabilities.  Advised patient of appointment today with [facility]

## 2016-10-31 NOTE — H&P (Signed)
Physician Assistant Signed Cardiology  Progress Notes Encounter Date: 11/15/2016 2:00 PM  Related encounter: Office Visit from 10/28/2016 in [place] [facility] All   [] Hide copied text    Cardiology Office Note    Date:  10/28/2016   ID:  Jose Monroe, DOB 1941-10-14, MRN [id number]  PCP:  Jose Asa, MD    Cardiologist:  Dr. Stanford Monroe      Chief Complaint  Patient presents with  . Follow-up    seen for Dr. Stanford Monroe, acute on chronic diastolic heart failure    History of Present Illness:  Jose Monroe is a 75 y.o. male with PMH of HTN, HLD, h/o moderate to severe AS, atrial flutter and metastaticrenal cell carcinoma. Echocardiogram obtained on 05/03/2015 showed EF 60-65% and mild LVH, grade 1 diastolic dysfunction, moderate to severe aortic stenosis, mean gradient 38 mmHg, peak gradient 74 mmHg.he had a history of renal cell cancer and apparently developed metastasis in the bone this spring, he suffered a pathologic shoulder fracture that required shoulder displacement in May. Metastasis was noted in his lung as well. He had a chemotherapy over summer but he developed interstitial lung disease. He was admitted to [facility] with respiratory failure in September. He and his wife has decided for him not to be intubated. He had a long hospital course lasting 17 days, hospice was recommended but the patient and his wife wanted to pursue medical therapy to keep him as strong as possible without going to any heroic measures. He gets around using a wheelchair at home. He has been transitioned to follow-up with Dr. Lake Bells.  He was last seen in the office on 10/16/2016, he was in atrial flutter with ventricular rate of 160 at the time. Metoprolol tartrate 12.5 mg twice a day and eliquis 5 mg twice a day was added. He underwent a scheduled TEE DCCV on 10/31, due to inability to see entire left atrial appendage, therefore cardioversion was not pursued.  TEE also showed severely thickened and heavily calcified trileaflet aortic valve with area 0.3 cm consistent with critical areas. There is also moderate to severe AR.  I saw the patient on 10/24/2016, patient was compliant with the eliquis at the time. He did have 2+ ankle edema, but no pretibial edema. His lung was clear at the time. Due to critical areas, I was hesitant to aggressively diurese him, I did ask him to take an additional dose of Lasix on that day. I called the patient on Monday 11/6, he was still having shortness of breath that unchanged, so I instructed the patient to take an additional dose of Lasix. I followed the patient up with another phone call on Wednesday 10/29/2016, he was stable at the time however beginning to notice some increased swelling. He took additional dose of Lasix this morning, he has not noticed any increase in the urine output. As far as his heart rate, I increased his metoprolol 37.5 mg twice a day, during our conversation on follow-up phone calls, he says typically his heart rate is in the 140s immediately after waking up, however as he rested a bit, his heart rate does go down to 100. He was urgent add-on today, his lower extremity edema has worsened since I saw him last week, despite the fact that he has taken several doses of increased Lasix. I think he needs to be aggressively diuresed, however I am also concerned about his critical aortic stenosis. He has increased his oxygen to  4 L/m from 2 L/m. His O2 saturation in the clinic is stable in the upper 90s. I have discussed with Dr. Stanford Monroe as I am not confident we can manage his heart rate or his volume status as outpatient. We plan to start IV amiodarone and and do inpatient DC cardioversion, he understand the increased risk of stroke if cardioversion has to be done prior to the 3 weeks of anticoagulation. Note apixaban was started on 10/26.   We had a long discussion with the patient, he is DO NOT RESUSCITATE at  this time, he does not want any intubation, resuscitation, CPR, or shock for any ventricular rhythm. He is okay to receive cardioversion for his atrial flutter though.        Past Medical History:  Diagnosis Date  . Hyperlipidemia   . Hypertension   . Renal cell cancer ([place])    Metastatic to right humerous; resected    Past Surgical History:  Procedure Laterality Date  . arm surgery Right    tumor  . HERNIA REPAIR    . KIDNEY SURGERY Right 02/2011   kidney cancer  . TEE WITHOUT CARDIOVERSION N/A 10/21/2016   Procedure: TRANSESOPHAGEAL ECHOCARDIOGRAM (TEE);  Surgeon: Sueanne Margarita, MD;  Location: [facility] ENDOSCOPY;  Service: Cardiovascular;  Laterality: N/A;  . TONSILLECTOMY      Current Medications:       Outpatient Medications Prior to Visit  Medication Sig Dispense Refill  . acetaminophen (TYLENOL) 500 MG tablet Take 500 mg by mouth.    Marland Kitchen albuterol (PROVENTIL HFA;VENTOLIN HFA) 108 (90 Base) MCG/ACT inhaler Inhale into the lungs.    Marland Kitchen apixaban (ELIQUIS) 5 MG TABS tablet Take 1 tablet (5 mg total) by mouth 2 (two) times daily. 60 tablet 3  . EPINEPHrine (EPIPEN 2-PAK) 0.3 mg/0.3 mL IJ SOAJ injection Use as directed    . furosemide (LASIX) 20 MG tablet Take 1 tablet (20 mg total) by mouth daily. 90 tablet 1  . metoprolol tartrate (LOPRESSOR) 25 MG tablet Take 1.5 tablets (37.5 mg total) by mouth 2 (two) times daily. 90 tablet 6  . Multiple Vitamin (MULTI-VITAMINS) TABS Take by mouth.    . Multiple Vitamins-Minerals (PRESERVISION/LUTEIN PO) Take by mouth.    . pantoprazole (PROTONIX) 40 MG tablet Take 1 tablet (40 mg total) by mouth daily. 90 tablet 1  . 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      Allergies:   Nivolumab   Social History        Social History  . Marital  status: Married    Spouse name: N/A  . Number of children: 2  . Years of education: N/A         Social History Main Topics  . Smoking status: Former Smoker    Packs/day: 1.00    Years: 25.00    Types: Cigarettes    Quit date: 10/15/2001  . Smokeless tobacco: Never Used  . Alcohol use 0.0 oz/week     Comment: 2 drinks per night  . Drug use: Unknown  . Sexual activity: Not Asked       Other Topics Concern  . None      Social History Narrative  . None     Family History:  The patient's family history includes Arthritis in his father, maternal grandmother, and mother; COPD in his  father; Cancer in his mother and sister; Diabetes in his mother; Heart disease in his father and sister; Hyperlipidemia in his father, mother, and paternal grandfather.   ROS:   Please see the history of present illness.    ROS All other systems reviewed and are negative.   PHYSICAL EXAM:   VS:  BP 125/78   Pulse (!) 148   Ht 5\' 11"  (1.803 m)   Wt 172 lb 6.4 oz (78.2 kg)   BMI 24.04 kg/m    GEN: Well nourished, well developed, in no acute distress  HEENT: normal  Neck: no JVD, carotid bruits, or masses Cardiac: Tachycardic, irregular; no rubs, or gallops,no edema  3+ pitting edema in bilateral LE. [id number] systolic murmurs in the apex and the right upper sternal border. Respiratory:  clear to auscultation bilaterally, normal work of breathing. Bibasilar crackle, noticeable decreased breath sound in the right lower extremity. GI: soft, nontender, nondistended, + BS MS: no deformity or atrophy  Skin: warm and dry, no rash Neuro:  Alert and Oriented x 3, Strength and sensation are intact Psych: euthymic mood, full affect     Wt Readings from Last 3 Encounters:  11/04/2016 172 lb 6.4 oz (78.2 kg)  10/24/16 164 lb 12.8 oz (74.8 kg)  10/21/16 158 lb (71.7 kg)      Studies/Labs Reviewed:   EKG:  EKG is not ordered today.    Recent Labs: 10/16/2016: TSH 1.18 10/22/2016:  ALT 23; BUN 20; Creatinine 1.1; Hemoglobin 11.3; Platelets 377; Potassium 5.0; Sodium 136   Lipid Panel Labs (Brief)          Component Value Date/Time   CHOL 97 (L) 08/06/2016 1323   TRIG 125 08/06/2016 1323   HDL 25 (L) 08/06/2016 1323   CHOLHDL 3.9 08/06/2016 1323   VLDL 25 08/06/2016 1323   LDLCALC 47 08/06/2016 1323      Additional studies/ records that were reviewed today include:  Echo 05/03/2015 LV EF: 60% - 65%  ------------------------------------------------------------------- Indications: Aortic Valve Disease (I35.9).  ------------------------------------------------------------------- History: PMH: Dyspnea. Aortic valve disease. Risk factors: Renal Cell Carcinoma (Right Nephrectomy) with Metastasis to Right Humerus. Status Post Radiation Therapy Family history of coronary artery disease. Former tobacco use. Hypertension. Dyslipidemia.  ------------------------------------------------------------------- Study Conclusions  - Left ventricle: The cavity size was normal. Wall thickness was increased in a pattern of mild LVH. Systolic function was normal. The estimated ejection fraction was in the range of 60% to 65%. Wall motion was normal; there were no regional wall motion abnormalities. Doppler parameters are consistent with abnormal left ventricular relaxation (grade 1 diastolic dysfunction). - Aortic valve: Trileaflet; severely calcified leaflets. There was trivial regurgitation. There was moderate, bordering on severe, aortic stenosis. Mean gradient (S): 38 mm Hg. Peak gradient (S): 74 mm Hg. Valve area (VTI): 1.35 cm^2. - Mitral valve: Mildly calcified annulus. Mildly calcified leaflets . There was no significant regurgitation. - Left atrium: The atrium was mildly dilated. - Right ventricle: The cavity size was normal. Systolic function was normal. - Pulmonary arteries: No complete TR doppler jet so unable  to estimate PA systolic pressure. - Systemic veins: IVC was not visualized.  Impressions:  - Normal LV size with mild LV hypertrophy. EF 60-65%. Severe calcified, trileaflet aortic valve. Moderate (nearing severe) aortic stenosis with mean gradient 38 mmHg and AVA 1.35 cm^2. Normal RV size and systolic function.   TEE 10/21/2016 Results: Normal LV size and function Normal RV size and function Normal RA Moderately dilated LA with spontaneous echo  contrast. The LA appendage is poorly seen due to shadowing from a marked prominence that prevents full visualization of the entire appendage in multiple views. Cannot, with confidence, rule out thrombus.  Normal TV with trivial TR Normal PV Normal MV with trivial MR Severely thickened and heavily calcified trileaflet AV. The leaflet are essentially fixed with the AVA by planimetry 0.3cm2 c/w critical AS. There is mild to moderate AR. Marked lipomatous hypertrophy of theinteratrial septum with no evidence of shunt by colorflow dopper  Moderate atherosclerotic plaque of the thoracic and ascending aorta.  The patient tolerated the procedure well but no DCCV was performed due to inability to see the entire appendage.      ASSESSMENT:    1. Atrial flutter with rapid ventricular response ([place])   2. Acute on chronic diastolic heart failure ([place])   3. Severe aortic stenosis   4. Essential hypertension   5. Hyperlipidemia, unspecified hyperlipidemia type   6. Metastatic cancer ([address])      PLAN:  In order of problems listed above:  1. Atrial flutter with RVR: I increased his metoprolol to 37.5 mg twice a day, his heart rate remained uncontrolled, now staying in the 140s range. Although I can keep up titrating the beta blocker, I am not confident we can control his heart rate very well as outpatient. We have attempted TEE on 10/21/2016, however cardioversion was unable to be done as left atrial appendage was  unable to be visualized. With plan to admit to the hospital today, continue on metoprolol up titrating the dose to 50 mg twice a day. Start on IV amiodarone.  2. Acute on chronic diastolic heart failure: He has remarkably gained roughly 8 pounds in the last 7 days despite increased Lasix for 3 days. He has not noticed significant urinary output. I think he will require IV Lasix, however given the critical areas, he would be very prone to reduction, therefore diuresis will have to be careful.  3. Severe aortic stenosis: Although previous transesophageal echo on 05/03/2015 which showed moderate bordering on severe aortic stenosis. However recent transesophageal echocardiogram showed severe critical aortic stenosis with valve area 0.3 m.  4. Metastatic renal cell carcinoma: He has metastasis to the lung and to the bones.  5. Hypertension: Blood pressure stable after increasing metoprolol tartrate to 37.5 during the last visit.  6. Hyperlipidemia: Currently not on a statin medication, we'll reassess fasting lipid panel at a later time.  Of note, patient is a DO NOT RESUSCITATE.  Medication Adjustments/Labs and Tests Ordered: Current medicines are reviewed at length with the patient today.  Concerns regarding medicines are outlined above.  Medication changes, Labs and Tests ordered today are listed in the Patient Instructions below. There are no Patient Instructions on file for this visit.   Hilbert Corrigan, [place]  11/07/2016 3:26 PM    [facility] [address], [place], [place]  [address] Phone: [phone]; Fax: [phone]      As above, patient seen and examined. Briefly he is a 75 year old male with past medical history of hypertension, hyperlipidemia, aortic stenosis, atrial flutter, metastatic renal cell carcinoma with atrial flutter and acute on chronic diastolic congestive heart failure. Patient has known metastatic renal cell carcinoma. He has been  seen in the office recently with atrial flutter with rapid ventricular response. He was placed on beta-blockade and underwent attempt at TEE guided cardioversion on October 31. However the cardioversion was not performed as the left atrial appendage  could not be visualized. Aortic valve severely calcified and by planimetry the aortic valve area of 0.3 cm. There is moderate to severe aortic insufficiency. The patient returns for follow-up and noticed progressive fatigue , dyspnea on exertion and pedal edema. Electrocardiogram shows continued atrial flutter. Apixaban initiated 10/26.   1 atrial flutter-the patient continues to be in atrial flutter with rapid ventricular response. This is likely the cause of his worsening congestive heart failure superimposed on diastolic dysfunction and aortic stenosis. Previous TEE was unable to visualize left atrial appendage. He has been on apixaban for 15 days. I will increase metoprolol to 50 mg twice a day. Add IV amiodarone. We will proceed with cardioversion this weekend or Monday. I explained at length that he has not been on anticoagulation for 3 consecutive weeks and the risk of stroke is higher. However he is most concerned about quality of life and states he wants to proceed with cardioversion for symptomatic relief regardless of risk of stroke.  2 acute on chronic diastolic congestive heart failure-volume overloaded on examination. We will add Lasix 40 mg IV twice a day. Follow renal function.  3 severe aortic stenosis-patient will not be a candidate for valve replacement given metastatic renal cell cancer.  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  Note I had a long discussion with patient today concerning his CODE STATUS. He has metastatic renal cell cancer and severe aortic stenosis. He does not want heroic measures and does not want intubation, defibrillation or CPR. He would like  cardioversion and medications that may improve quality of life.  Kirk Ruths, MD

## 2016-10-31 NOTE — Progress Notes (Signed)
  Amiodarone Drug - Drug Interaction Consult Note  Recommendations: No drug interactions noted at this time.  Amiodarone is metabolized by the cytochrome P450 system and therefore has the potential to cause many drug interactions. Amiodarone has an average plasma half-life of 50 days (range 20 to 100 days).   There is potential for drug interactions to occur several weeks or months after stopping treatment and the onset of drug interactions may be slow after initiating amiodarone.   []  Statins: Increased risk of myopathy. Simvastatin- restrict dose to 20mg  daily. Other statins: counsel patients to report any muscle pain or weakness immediately.  []  Anticoagulants: Amiodarone can increase anticoagulant effect. Consider warfarin dose reduction. Patients should be monitored closely and the dose of anticoagulant altered accordingly, remembering that amiodarone levels take several weeks to stabilize.  []  Antiepileptics: Amiodarone can increase plasma concentration of phenytoin, the dose should be reduced. Note that small changes in phenytoin dose can result in large changes in levels. Monitor patient and counsel on signs of toxicity.  []  Beta blockers: increased risk of bradycardia, AV block and myocardial depression. Sotalol - avoid concomitant use.  []   Calcium channel blockers (diltiazem and verapamil): increased risk of bradycardia, AV block and myocardial depression.  []   Cyclosporine: Amiodarone increases levels of cyclosporine. Reduced dose of cyclosporine is recommended.  []  Digoxin dose should be halved when amiodarone is started.  []  Diuretics: increased risk of cardiotoxicity if hypokalemia occurs.  []  Oral hypoglycemic agents (glyburide, glipizide, glimepiride): increased risk of hypoglycemia. Patient's glucose levels should be monitored closely when initiating amiodarone therapy.   []  Drugs that prolong the QT interval:  Torsades de pointes risk may be increased with concurrent use  - avoid if possible.  Monitor QTc, also keep magnesium/potassium WNL if concurrent therapy can't be avoided. Marland Kitchen Antibiotics: e.g. fluoroquinolones, erythromycin. . Antiarrhythmics: e.g. quinidine, procainamide, disopyramide, sotalol. . Antipsychotics: e.g. phenothiazines, haloperidol.  . Lithium, tricyclic antidepressants, and methadone. Thank You,  Brain Hilts  11/19/2016 4:51 PM

## 2016-10-31 NOTE — Telephone Encounter (Signed)
New message      Pt recently saw [facility].  He has AFIB.  Calling to report new symptoms--leg swelling and really tired.  Please call

## 2016-10-31 NOTE — Patient Instructions (Signed)
PATIENT BEING ADMITTED

## 2016-10-31 NOTE — Telephone Encounter (Signed)
Spoke with pharmacy and there was a question about quantity of Rx sent over for Lopressor. Advised should be a 30 day Rx not less than 30 days

## 2016-10-31 NOTE — Telephone Encounter (Signed)
[employer] [employer] psychologist) is calling to get clarification for  Lopressor . Please call   Thanks

## 2016-10-31 NOTE — Progress Notes (Signed)
New direct patient admission. Pt brought to the floor in stable condition. Vitals taken. Initial Assessment done. All immediate pertinent needs to patient addressed. Patient Guide given to patient. Important safety instructions relating to hospitalization reviewed with patient. Patient verbalized understanding. Will continue to monitor pt.  Maurene Capes RN

## 2016-11-01 ENCOUNTER — Inpatient Hospital Stay (HOSPITAL_COMMUNITY): Payer: Medicare Other

## 2016-11-01 DIAGNOSIS — I4892 Unspecified atrial flutter: Secondary | ICD-10-CM

## 2016-11-01 DIAGNOSIS — R570 Cardiogenic shock: Secondary | ICD-10-CM

## 2016-11-01 DIAGNOSIS — J810 Acute pulmonary edema: Secondary | ICD-10-CM

## 2016-11-01 LAB — BASIC METABOLIC PANEL
ANION GAP: 19 — AB (ref 5–15)
BUN: 32 mg/dL — ABNORMAL HIGH (ref 6–20)
CHLORIDE: 96 mmol/L — AB (ref 101–111)
CO2: 16 mmol/L — AB (ref 22–32)
Calcium: 9 mg/dL (ref 8.9–10.3)
Creatinine, Ser: 1.75 mg/dL — ABNORMAL HIGH (ref 0.61–1.24)
GFR calc Af Amer: 42 mL/min — ABNORMAL LOW (ref >60)
GFR, EST NON AFRICAN AMERICAN: 36 mL/min — AB (ref >60)
GLUCOSE: 111 mg/dL — AB (ref 65–99)
POTASSIUM: 5.6 mmol/L — AB (ref 3.5–5.1)
Sodium: 131 mmol/L — ABNORMAL LOW (ref 135–145)

## 2016-11-01 LAB — COOXEMETRY PANEL
CARBOXYHEMOGLOBIN: 0.9 % (ref 0.5–1.5)
Carboxyhemoglobin: 1.2 % (ref 0.5–1.5)
METHEMOGLOBIN: 0.9 % (ref 0.0–1.5)
METHEMOGLOBIN: 1.1 % (ref 0.0–1.5)
O2 Saturation: 41 %
O2 Saturation: 62.9 %
TOTAL HEMOGLOBIN: 10.3 g/dL — AB (ref 12.0–16.0)
Total hemoglobin: 12.7 g/dL (ref 12.0–16.0)

## 2016-11-01 LAB — LIPID PANEL
CHOL/HDL RATIO: 4.9 ratio
Cholesterol: 161 mg/dL (ref 0–200)
HDL: 33 mg/dL — AB (ref >40)
LDL Cholesterol: 116 mg/dL — ABNORMAL HIGH (ref 0–99)
Triglycerides: 59 mg/dL (ref <150)
VLDL: 12 mg/dL (ref 0–40)

## 2016-11-01 LAB — MRSA PCR SCREENING: MRSA BY PCR: NEGATIVE

## 2016-11-01 MED ORDER — MAGNESIUM HYDROXIDE 400 MG/5ML PO SUSP
30.0000 mL | Freq: Every day | ORAL | Status: DC | PRN
  Administered 2016-11-02: 30 mL via ORAL
  Filled 2016-11-01: qty 30

## 2016-11-01 MED ORDER — FUROSEMIDE 10 MG/ML IJ SOLN
40.0000 mg | Freq: Four times a day (QID) | INTRAMUSCULAR | Status: DC
  Administered 2016-11-01 – 2016-11-02 (×3): 40 mg via INTRAVENOUS
  Filled 2016-11-01 (×4): qty 4

## 2016-11-01 MED ORDER — ORAL CARE MOUTH RINSE
15.0000 mL | Freq: Two times a day (BID) | OROMUCOSAL | Status: DC
  Administered 2016-11-01 – 2016-11-03 (×3): 15 mL via OROMUCOSAL

## 2016-11-01 MED ORDER — NOREPINEPHRINE BITARTRATE 1 MG/ML IV SOLN
0.0000 ug/min | INTRAVENOUS | Status: DC
  Administered 2016-11-01: 10 ug/min via INTRAVENOUS
  Filled 2016-11-01: qty 4

## 2016-11-01 MED ORDER — PREDNISONE 20 MG PO TABS
40.0000 mg | ORAL_TABLET | Freq: Every day | ORAL | Status: DC
  Administered 2016-11-02: 40 mg via ORAL
  Filled 2016-11-01 (×3): qty 2

## 2016-11-01 MED ORDER — DEXTROSE 5 % IV SOLN
0.0000 ug/min | INTRAVENOUS | Status: DC
  Administered 2016-11-01: 20 ug/min via INTRAVENOUS
  Filled 2016-11-01 (×3): qty 16

## 2016-11-01 MED ORDER — FUROSEMIDE 10 MG/ML IJ SOLN
40.0000 mg | Freq: Once | INTRAMUSCULAR | Status: AC
  Administered 2016-11-01: 40 mg via INTRAVENOUS

## 2016-11-01 MED ORDER — BLISTEX MEDICATED EX OINT
TOPICAL_OINTMENT | CUTANEOUS | Status: DC | PRN
  Administered 2016-11-01: 1 via TOPICAL
  Filled 2016-11-01: qty 6.3

## 2016-11-01 MED ORDER — ALPRAZOLAM 0.25 MG PO TABS
0.2500 mg | ORAL_TABLET | Freq: Once | ORAL | Status: AC
  Administered 2016-11-01: 0.25 mg via ORAL
  Filled 2016-11-01: qty 1

## 2016-11-01 NOTE — Consult Note (Signed)
PULMONARY / CRITICAL CARE MEDICINE   Name: Jose Monroe MRN: [id number] DOB: 1941/03/17    ADMISSION DATE:  11/18/2016 CONSULTATION DATE:  11/01/2016  REFERRING MD:  Dr. Meda Coffee  CHIEF COMPLAINT:  Dyspnea  HISTORY OF PRESENT ILLNESS:   75 year old male well known to me from a recent clinic consultation for chronic respiratory failure with hypoxemia in the setting of interstitial lung disease thought secondary to cancer treatment.  PMH of renal cell carcinoma s/p chemo  / XRT.  04/2016 found to have "a spot on his lung" and FOB was consistent with recurrent renal cell carcinoma treated with Vorient (had a severe reaction with kidney / liver involvement).  He later was started on Opdivo (recieved 2 doses).  He developed cough and underwent CT scan at [facility] which was worrisome for fibrosis.  Most recent baseline O2 has been 2-4L.   He was seen by Cardiology on multiple occasions the first of November for increased swelling and tachycardia.  Lopressor & diuretics were adjusted - discussions held regarding cardioversion / IV amiodarone if needed.  Started on apixaban 10/26.  Admitted 11/10 with concerns for decompensated CHF.  Found to be in [place].   he was started on amiodarone. However, today he's noted increasing leg swelling and oliguria.  He reports no change in his baseline cough but is noting notably increased leg swelling compared to baseline. No abdominal pain. He does have some increased shortness of breath. No chest pain. He is new to the intensive care unit for initiation of vasopressors. Notably, he has critical aortic stenosis.  He tells me that his oncology doctor decrease the dose of his prednisone this past week.   PAST MEDICAL HISTORY :  He  has a past medical history of Hyperlipidemia; Hypertension; and Renal cell cancer ([place]).  PAST SURGICAL HISTORY: He  has a past surgical history that includes Kidney surgery (Right, 02/2011); Hernia repair; arm surgery (Right); Tonsillectomy;  and TEE without cardioversion (N/A, 10/21/2016).  Allergies  Allergen Reactions  . Nivolumab Shortness Of Breath    Pneumonitis    No current facility-administered medications on file prior to encounter.    Current Outpatient Prescriptions on File Prior to Encounter  Medication Sig  . acetaminophen (TYLENOL) 500 MG tablet Take 500 mg by mouth every 8 (eight) hours as needed for mild pain.   Marland Kitchen apixaban (ELIQUIS) 5 MG TABS tablet Take 1 tablet (5 mg total) by mouth 2 (two) times daily.  Marland Kitchen EPINEPHrine (EPIPEN 2-PAK) 0.3 mg/0.3 mL IJ SOAJ injection Use as directed  . furosemide (LASIX) 20 MG tablet Take 1 tablet (20 mg total) by mouth daily.  . metoprolol tartrate (LOPRESSOR) 25 MG tablet Take 1.5 tablets (37.5 mg total) by mouth 2 (two) times daily.  . Multiple Vitamin (MULTI-VITAMINS) TABS Take by mouth.  . Multiple Vitamins-Minerals (PRESERVISION/LUTEIN PO) Take by mouth.  . pantoprazole (PROTONIX) 40 MG tablet Take 1 tablet (40 mg total) by mouth daily.  . predniSONE (DELTASONE) 20 MG tablet pt takes 25mg  daily  . albuterol (PROVENTIL HFA;VENTOLIN HFA) 108 (90 Base) MCG/ACT inhaler Inhale 1 puff into the lungs every 4 (four) hours as needed for wheezing.     FAMILY HISTORY:  His indicated that his mother is deceased. He indicated that his father is deceased. He indicated that his sister is deceased. He indicated that his maternal grandmother is deceased. He indicated that his maternal grandfather is deceased. He indicated that his paternal grandmother is deceased. He indicated that his paternal grandfather is deceased.  SOCIAL HISTORY: He  reports that he quit smoking about 15 years ago. His smoking use included Cigarettes. He has a 25.00 pack-year smoking history. He has never used smokeless tobacco. He reports that he drinks alcohol.  REVIEW OF SYSTEMS:   Gen: Denies fever, chills, weight change, fatigue, night sweats HEENT: Denies blurred vision, double vision, hearing loss,  tinnitus, sinus congestion, rhinorrhea, sore throat, neck stiffness, dysphagia PULM: per HPI CV: Denies chest pain, + edema, orthopnea, paroxysmal nocturnal dyspnea, palpitations GI: Denies abdominal pain, nausea, vomiting, diarrhea, hematochezia, melena, constipation, change in bowel habits GU: Denies dysuria, hematuria, polyuria, oliguria, urethral discharge Endocrine: Denies hot or cold intolerance, polyuria, polyphagia or appetite change Derm: Denies rash, dry skin, scaling or peeling skin change Heme: Denies easy bruising, bleeding, bleeding gums Neuro: Denies headache, numbness, weakness, slurred speech, loss of memory or consciousness   SUBJECTIVE:  As above  VITAL SIGNS: BP (!) 84/44   Pulse 70   Temp 97.5 F (36.4 C) (Axillary)   Resp 17   Ht 5\' 11"  (1.803 m)   Wt 77 kg (169 lb 11.2 oz)   SpO2 (!) 82%   BMI 23.67 kg/m   HEMODYNAMICS: CVP:  [21 mmHg] 21 mmHg  VENTILATOR SETTINGS: FiO2 (%):  [40 %] 40 %  INTAKE / OUTPUT: I/O last 3 completed shifts: In: 1209.3 [P.O.:820; I.V.:389.3] Out: 500 [Urine:500]  PHYSICAL EXAMINATION: General:  Chronically ill appearing but comfortable in bed Neuro:  Awake, alert, oriented 4 HEENT:  Normocephalic atraumatic, oropharynx clear Cardiovascular:  Irregularly irregular, normal rate, no JVD Lungs:  Crackles bases bilaterally Abdomen:  Belly soft, nontender Musculoskeletal:  Normal bulk and tone Skin:  Increased edema to pretibial area bilaterally compared to when I last examined him on 10/25.  LABS:  BMET  Recent Labs Lab 10/26/2016 1857 11/01/16 0537  NA 130* 131*  K 5.1 5.6*  CL 94* 96*  CO2 25 16*  BUN 26* 32*  CREATININE 1.33* 1.75*  GLUCOSE 222* 111*    Electrolytes  Recent Labs Lab 10/27/2016 1857 11/01/16 0537  CALCIUM 9.0 9.0    CBC  Recent Labs Lab 11/09/2016 1857  WBC 10.1  HGB 11.6*  HCT 36.6*  PLT 417*    Coag's No results for input(s): APTT, INR in the last 168 hours.  Sepsis  Markers No results for input(s): LATICACIDVEN, PROCALCITON, O2SATVEN in the last 168 hours.  ABG No results for input(s): PHART, PCO2ART, PO2ART in the last 168 hours.  Liver Enzymes  Recent Labs Lab 11/10/2016 1857  AST 83*  ALT 153*  ALKPHOS 120  BILITOT 0.5  ALBUMIN 2.5*    Cardiac Enzymes No results for input(s): TROPONINI, PROBNP in the last 168 hours.  Glucose No results for input(s): GLUCAP in the last 168 hours.  Imaging Dg Chest Port 1 View  Result Date: 11/01/2016 CLINICAL DATA:  Central venous catheter placement EXAM: PORTABLE CHEST 1 VIEW COMPARISON:  11/09/2016 FINDINGS: Right IJ catheter tip is in the SVC. No pneumothorax identified. The heart size is mildly enlarged. There is moderate diffuse pulmonary edema. Bilateral patchy airspace opacities are unchanged from previous exam. Small pleural effusions are noted left greater than right. When compared with the previous exam IMPRESSION: Right IJ catheter tip is at the cavoatrial junction. No complications identified. No change in aeration a lungs compared with previous exam. Electronically Signed   By: Kerby Moors M.D.   On: 11/01/2016 17:18   Dg Chest Port 1 View  Result Date: 11/01/2016 CLINICAL DATA:  Initial evaluation for acute respiratory distress, shortness of breath. EXAM: PORTABLE CHEST 1 VIEW COMPARISON:  Prior radiograph from 10/15/2016. FINDINGS: Moderate cardiomegaly, stable from prior. Mediastinal silhouette within normal limits. Aortic atherosclerosis noted. Lungs are mildly hypoinflated. There is extensive pulmonary vascular congestion with indistinctness of the interstitial markings, suspected to largely reflect pulmonary edema. Multi focal infection could also have this appearance, and could be considered in the correct clinical setting. No definite pleural effusion. No pneumothorax. No acute osseous abnormality. Radiopaque metallic densities overlying the right axilla, stable. IMPRESSION: 1.  Cardiomegaly with diffuse pulmonary vascular congestion and indistinctness of the interstitial markings, greatest within the mid and lower lung zones bilaterally. Findings favored to reflect pulmonary edema, although extensive multifocal infection could also have this appearance. 2. Stable cardiomegaly. 3. Aortic atherosclerosis. Electronically Signed   By: Jeannine Boga M.D.   On: 11/01/2016 00:09    Chest x-ray images from this hospitalization reviewed and compared to the study from October 25. There is a notable increase in bilateral airspace disease without significant pleural effusions.    DISCUSSION: 75 year old male with a very complex past medical history significant for metastatic renal cell carcinoma, interstitial lung disease felt to be related to anticancer treatment, chronic a flutter and atrial fibrillation with severe aortic stenosis admitted for acute respiratory distress in the setting of notable volume overload secondary to pulmonary edema.  I do not think that his ILD has progressed necessarily, but considering his increased dyspnea and changes on physical exam I think it's reasonable to increase his prednisone back up to 40 mg for the time being.  I agree with the plan from the cardiology and advanced heart failure teams to provide inotrope therapy to hopefully improve his cardiac output and facilitate diuresis.  However, given his recent very severe and acute interstitial lung disease I feel strongly that we should avoid amiodarone. We'll discontinue this for now as he is currently rate controlled.  ASSESSMENT / PLAN:  PULMONARY A: Chronic interstitial lung disease felt to be related to anticancer treatment (nivolimab) Acute pulmonary edema Chronic respiratory failure with hypoxemia P:   Increase prednisone to 40 mg Continue diuresis Stop amiodarone, would avoid this Titrate O2 as needed to maintain O2 saturation greater than 90% Chest x-ray when  necessary  CARDIOVASCULAR A:  Cardiogenic shock Atrial fibrillation/A flutter Critical aortic stenosis P:  Per cardiology  Code status DNR.  This is a complicated situation.  He looks much weaker to me compared to when I saw him on 10/25.  He is remarkably strong emotionally despite everything he has been through lately but he will not do well with a prolonged ICU stay.  Roselie Awkward, MD [place] [facility] Pager: [phone] Cell: [phone] After 3pm or if no response, call [phone]   11/01/2016, 5:54 PM

## 2016-11-01 NOTE — Significant Event (Signed)
Rapid Response Event Note  Overview:  Called by RN for patient desatting into the 70's and SBP 83.   Time Called: 1427 Arrival Time: 1435 Event Type: Respiratory, Cardiac  Initial Focused Assessment:  Called by RN for patient with decreased sats into the 70's on nasal cannula and was placed on venti mask, and SBP 80's.  On my arrival to [place] room, RT and RN at bedside.  RT is switching ventimask to nasal cannula at 4LPM.  Patient  not in any respiratory distress, currently sleeping Skin warm and dry Current BP 74/34.  Breath Sounds crackles   AS per RN patient has not voided all day and bladder scan indicates 0cc.   Interventions:  MD called  Plan of Care (if not transferred):  RN to monitor,   Event Summary:  RN to call if assistance    at      at          [facility], [place]

## 2016-11-01 NOTE — Progress Notes (Signed)
Patient hypotensive , stopped amiodarone per conversation with MD Meda Coffee.

## 2016-11-01 NOTE — Progress Notes (Signed)
Foley inserted at 68 with assistance on [place], [place].

## 2016-11-01 NOTE — Procedures (Signed)
Central Venous Catheter Insertion Procedure Note Jose Monroe [id number] [birth year]  Procedure: Insertion of Central Venous Catheter Indications: Assessment of intravascular volume  Procedure Details Consent: Risks of procedure as well as the alternatives and risks of each were explained to the (patient/caregiver).  Consent for procedure obtained. Time Out: Verified patient identification, verified procedure, site/side was marked, verified correct patient position, special equipment/implants available, medications/allergies/relevent history reviewed, required imaging and test results available.  Performed  Maximum sterile technique was used including antiseptics, cap, gloves, gown, hand hygiene, mask and sheet. Skin prep: Chlorhexidine; local anesthetic administered A antimicrobial bonded/coated triple lumen catheter was placed in the right internal jugular vein using the Seldinger technique.  Evaluation Blood flow good Complications: No apparent complications Patient did tolerate procedure well. Chest X-ray ordered to verify placement.  CXR: pending.  Glori Bickers MD 11/01/2016, 4:46 PM

## 2016-11-01 NOTE — Progress Notes (Signed)
Paged cardiology fellow regarding patient's co-ox of 41.

## 2016-11-01 NOTE — Progress Notes (Addendum)
   Patient with progressive deterioration throughout the day. Now anuric.   SBP in 70-80s. Remains in AFL. Amio stopped due to hypotension.  Seen by Dr. Meda Coffee and transferred to [facility] for initiation of inotropic support.   On exam he is weak and pale but alert and conversant. Falls asleep easily. Sats in 80s Cor IRR 60-70. AS murmur with obliterated S2 Lungs mild crackles.  Ab soft. NT/ND Ext: cool and cyanotic. Trivial edema.  He is currently in cardiogenic shock with low output and progressive renal failure.  We discussed options and he wants to pursue aggressive therapy despite his metastatic RCC and severe AS.  Will place central access and support with norepinephrine so hopefully we can diurese more effectively. Follow CVP and co-ox. May be candidate for BAV if renal function recovers.   D/W Dr. Meda Coffee.   The patient is critically ill with multiple organ systems failure and requires high complexity decision making for assessment and support, frequent evaluation and titration of therapies, application of advanced monitoring technologies and extensive interpretation of multiple databases.   Critical Care Time devoted to patient care services described in this note is 35 Minutes.    Bensimhon, Daniel,MD 4:58 PM

## 2016-11-01 NOTE — Progress Notes (Signed)
Patient complained of shortness of breath and oxygen was turned up to 4.5L via nasal cannula. Patient then complained of more dyspnea at rest, O2 sats were 99% on 4.5L of oxygen, listened to lungs and discovered wheezing. I called respiratory and was advised to give an albuterol treatment, patient complained of "gasping for air', respiratory came to assess/rapid nurse called/Physician notified. Physician ordered a one time does of lasix and xanax, rapid nurse ordered a chest xray, respiratory implemented a venturi mask. Patient started to feel better, but still very anxious about his condition.

## 2016-11-01 NOTE — Progress Notes (Signed)
Patient Name: Jose Monroe Date of Encounter: 11/01/2016  Active Problems:   Atrial flutter with rapid ventricular response ([place])   Length of Stay: 1  SUBJECTIVE  He feels very SOB, couldn't sleep.   CURRENT MEDS . apixaban  5 mg Oral BID  . furosemide      . furosemide  40 mg Intravenous BID  . metoprolol tartrate  50 mg Oral BID  . pantoprazole  40 mg Oral Daily  . predniSONE  25 mg Oral Q breakfast   . amiodarone 30 mg/hr (11/01/16 0716)   OBJECTIVE  Vitals:   11/01/16 0022 11/01/16 0030 11/01/16 0549 11/01/16 0916  BP: 119/69  113/75 100/60  Pulse: 93  98 74  Resp: 18  18   Temp: 97.6 F (36.4 C)     TempSrc: Oral  Oral   SpO2: 100% 100% 100% 98%  Weight:   169 lb 11.2 oz (77 kg)   Height:        Intake/Output Summary (Last 24 hours) at 11/01/16 0947 Last data filed at 11/01/16 0936  Gross per 24 hour  Intake          1289.26 ml  Output              500 ml  Net           789.26 ml   Filed Weights   10/29/2016 1639 11/01/16 0549  Weight: 167 lb 11.2 oz (76.1 kg) 169 lb 11.2 oz (77 kg)    PHYSICAL EXAM  General: Pleasant, NAD. Neuro: Alert and oriented X 3. Moves all extremities spontaneously. Psych: Normal affect. HEENT:  Normal  Neck: Supple without bruits, elevated JVD B/L. Lungs:  Resp regular and unlabored, crackles B/L up to mid lungs. Heart: RRR no s3, s4, or murmurs. Abdomen: Soft, non-tender, non-distended, BS + x 4.  Extremities: No clubbing, cyanosis, edema B/L +2. DP/PT/Radials 2+ and equal bilaterally.  Accessory Clinical Findings  CBC  Recent Labs  11/20/2016 1857  WBC 10.1  NEUTROABS 7.9*  HGB 11.6*  HCT 36.6*  MCV 96.1  PLT [id number]*   Basic Metabolic Panel  Recent Labs  11/07/2016 1857 11/01/16 0537  NA 130* 131*  K 5.1 5.6*  CL 94* 96*  CO2 25 16*  GLUCOSE 222* 111*  BUN 26* 32*  CREATININE 1.33* 1.75*  CALCIUM 9.0 9.0   Liver Function Tests  Recent Labs  10/27/2016 1857  AST 83*  ALT 153*  ALKPHOS 120    BILITOT 0.5  PROT 6.7  ALBUMIN 2.5*    Recent Labs  11/01/16 0537  CHOL 161  HDL 33*  LDLCALC 116*  TRIG 59  CHOLHDL 4.9   Thyroid Function Tests  Recent Labs  11/01/2016 1857  TSH 1.656    Radiology/Studies  Dg Chest 2 View  Result Date: 10/15/2016 CLINICAL DATA:  Shortness of breath. EXAM: CHEST  2 VIEW COMPARISON:  08/20/2010 . FINDINGS: Cardiomegaly. Diffuse bilateral pulmonary interstitial prominence. Congestive heart failure cannot be excluded. Under etiologies of interstitial lung disease including pneumonitis cannot be excluded. Degenerative changes thoracic spine. Surgical coils noted over the right axilla. Right shoulder replacement . IMPRESSION: 1. Cardiomegaly. 2. Diffuse prominent bilateral from interstitial prominence, new from 08/20/2010. Interstitial edema and/or interstitial lung disease including pneumonitis could present this fashion. No underlying chronic component interstitial lung disease cannot be excluded. Electronically Signed   By: Firth   On: 10/15/2016 14:34   Dg Chest Port 1 View  Result Date: 11/01/2016  CLINICAL DATA:  Initial evaluation for acute respiratory distress, shortness of breath. EXAM: PORTABLE CHEST 1 VIEW COMPARISON:  Prior radiograph from 10/15/2016. FINDINGS: Moderate cardiomegaly, stable from prior. Mediastinal silhouette within normal limits. Aortic atherosclerosis noted. Lungs are mildly hypoinflated. There is extensive pulmonary vascular congestion with indistinctness of the interstitial markings, suspected to largely reflect pulmonary edema. Multi focal infection could also have this appearance, and could be considered in the correct clinical setting. No definite pleural effusion. No pneumothorax. No acute osseous abnormality. Radiopaque metallic densities overlying the right axilla, stable. IMPRESSION: 1. Cardiomegaly with diffuse pulmonary vascular congestion and indistinctness of the interstitial markings, greatest within  the mid and lower lung zones bilaterally. Findings favored to reflect pulmonary edema, although extensive multifocal infection could also have this appearance. 2. Stable cardiomegaly. 3. Aortic atherosclerosis. Electronically Signed   By: Jeannine Boga M.D.   On: 11/01/2016 00:09   TELE: a-flutter with variable response ventricular rate 82-102     ASSESSMENT AND PLAN  75 year old male with past medical history of hypertension, hyperlipidemia, aortic stenosis, atrial flutter, metastatic renal cell carcinoma with atrial flutter and acute on chronic diastolic congestive heart failure. Patient has known metastatic renal cell carcinoma. He has been seen in the office recently with atrial flutter with rapid ventricular response. He was placed on beta-blockade and underwent attempt at TEE guided cardioversion on October 31. However the cardioversion was not performed as the left atrial appendage could not be visualized. Aortic valve severely calcified and by planimetry the aortic valve area of 0.3 cm. There is moderate to severe aortic insufficiency. The patient returns for follow-up and noticed progressive fatigue , dyspnea on exertion and pedal edema. Electrocardiogram shows continued atrial flutter. Apixaban initiated 10/26.   1 atrial flutter-the patient continues to be in atrial flutter with rapid ventricular response. This is most probably the cause of his worsening congestive heart failure superimposed on diastolic dysfunction and aortic stenosis. Previous TEE was unable to visualize left atrial appendage. He has been on apixaban for 15 days. We increased metoprolol to 50 mg twice a day. Added IV amiodarone, he is now rate controlled. He is severely fluid overloaded, I will increase lasix, but is doesn't improve and Crea continues to increase, I will attempt to transfer to [facility] tomorrow for a cardioversion. Otherwise on Monday. He was explained at length that he has not been on anticoagulation for  3 consecutive weeks and the risk of stroke is higher. However he is most concerned about quality of life and states he wants to proceed with cardioversion for symptomatic relief regardless of risk of stroke.  2 acute on chronic diastolic congestive heart failure-volume overloaded on examination. Increase Lasix 40 mg iv Q6H.  Follow renal function.  3 severe aortic stenosis-patient will not be a candidate for valve replacement given metastatic renal cell cancer.  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   Signed, Ena Dawley MD, [facility] 11/01/2016

## 2016-11-01 NOTE — Progress Notes (Signed)
Pt just got transferred to higher level of care (2H-04), pt's b.p was down to 73/35 as a lowest, informed to MD, pt's oxygen sat was maintained to 98-100%, pt was responding very well, RR nurse informed, pt's wife called and informed about the situation, report provided to the receiving nurse in person.

## 2016-11-01 NOTE — Progress Notes (Signed)
RN called for increase SOB, prior to RRt arrival MD paged new orders given for Lasix 40 mg. portable CXR ordered and completed. No additional orders at this time, plan to monitor pt call as needed, RRt to follow. Hand off given to [facility]

## 2016-11-02 DIAGNOSIS — N179 Acute kidney failure, unspecified: Secondary | ICD-10-CM | POA: Diagnosis present

## 2016-11-02 DIAGNOSIS — R0603 Acute respiratory distress: Secondary | ICD-10-CM

## 2016-11-02 LAB — BASIC METABOLIC PANEL
ANION GAP: 19 — AB (ref 5–15)
ANION GAP: 19 — AB (ref 5–15)
ANION GAP: 19 — AB (ref 5–15)
BUN: 50 mg/dL — ABNORMAL HIGH (ref 6–20)
BUN: 54 mg/dL — ABNORMAL HIGH (ref 6–20)
BUN: 60 mg/dL — ABNORMAL HIGH (ref 6–20)
CALCIUM: 7.5 mg/dL — AB (ref 8.9–10.3)
CALCIUM: 6.9 mg/dL — AB (ref 8.9–10.3)
CALCIUM: 6.2 mg/dL — AB (ref 8.9–10.3)
CO2: 19 mmol/L — ABNORMAL LOW (ref 22–32)
CO2: 17 mmol/L — AB (ref 22–32)
CO2: 19 mmol/L — AB (ref 22–32)
CREATININE: 3.34 mg/dL — AB (ref 0.61–1.24)
CREATININE: 3.55 mg/dL — AB (ref 0.61–1.24)
CREATININE: 3.77 mg/dL — AB (ref 0.61–1.24)
Chloride: 91 mmol/L — ABNORMAL LOW (ref 101–111)
Chloride: 90 mmol/L — ABNORMAL LOW (ref 101–111)
Chloride: 88 mmol/L — ABNORMAL LOW (ref 101–111)
GFR, EST AFRICAN AMERICAN: 19 mL/min — AB (ref >60)
GFR, EST AFRICAN AMERICAN: 18 mL/min — AB (ref >60)
GFR, EST AFRICAN AMERICAN: 17 mL/min — AB (ref >60)
GFR, EST NON AFRICAN AMERICAN: 17 mL/min — AB (ref >60)
GFR, EST NON AFRICAN AMERICAN: 15 mL/min — AB (ref >60)
GFR, EST NON AFRICAN AMERICAN: 14 mL/min — AB (ref >60)
Glucose, Bld: 86 mg/dL (ref 65–99)
Glucose, Bld: 113 mg/dL — ABNORMAL HIGH (ref 65–99)
Glucose, Bld: 163 mg/dL — ABNORMAL HIGH (ref 65–99)
Potassium: 6.4 mmol/L (ref 3.5–5.1)
Potassium: 6.3 mmol/L (ref 3.5–5.1)
Potassium: 6.5 mmol/L (ref 3.5–5.1)
SODIUM: 129 mmol/L — AB (ref 135–145)
SODIUM: 126 mmol/L — AB (ref 135–145)
Sodium: 126 mmol/L — ABNORMAL LOW (ref 135–145)

## 2016-11-02 LAB — COOXEMETRY PANEL
Carboxyhemoglobin: 1.5 % (ref 0.5–1.5)
METHEMOGLOBIN: 0.8 % (ref 0.0–1.5)
O2 Saturation: 72 %
Total hemoglobin: 12.3 g/dL (ref 12.0–16.0)

## 2016-11-02 MED ORDER — SODIUM POLYSTYRENE SULFONATE 15 GM/60ML PO SUSP
30.0000 g | Freq: Once | ORAL | Status: AC
  Administered 2016-11-02: 30 g via ORAL
  Filled 2016-11-02: qty 120

## 2016-11-02 MED ORDER — CALCIUM GLUCONATE 10 % IV SOLN: 1.0000 g | Freq: Once | INTRAVENOUS | Status: DC

## 2016-11-02 MED ORDER — SODIUM CHLORIDE 0.9 % IV SOLN: 1.0000 g | Freq: Once | INTRAVENOUS | Status: DC

## 2016-11-02 MED ORDER — GUAIFENESIN-DM 100-10 MG/5ML PO SYRP
10.0000 mL | ORAL_SOLUTION | ORAL | Status: DC | PRN
  Administered 2016-11-02: 10 mL via ORAL
  Filled 2016-11-02: qty 10

## 2016-11-02 MED ORDER — DEXTROSE 5 % IV SOLN
160.0000 mg | Freq: Three times a day (TID) | INTRAVENOUS | Status: DC
  Administered 2016-11-02 – 2016-11-03 (×3): 160 mg via INTRAVENOUS
  Administered 2016-11-03: 08:00:00 via INTRAVENOUS
  Filled 2016-11-02 (×7): qty 16

## 2016-11-02 MED ORDER — SODIUM CHLORIDE 0.9 % IV SOLN
1.0000 g | Freq: Once | INTRAVENOUS | Status: AC
  Administered 2016-11-02: 1 g via INTRAVENOUS
  Filled 2016-11-02: qty 10

## 2016-11-02 MED ORDER — "THROMBI-PAD 3""X3"" EX PADS"
1.0000 | MEDICATED_PAD | Freq: Once | CUTANEOUS | Status: DC
  Filled 2016-11-02: qty 1

## 2016-11-02 MED ORDER — SODIUM CHLORIDE 0.9 % IV SOLN
1.0000 mg/h | INTRAVENOUS | Status: DC
  Administered 2016-11-02: 1 mg/h via INTRAVENOUS
  Filled 2016-11-02: qty 10

## 2016-11-02 MED ORDER — FUROSEMIDE 10 MG/ML IJ SOLN
160.0000 mg | Freq: Once | INTRAMUSCULAR | Status: AC
  Administered 2016-11-02: 160 mg via INTRAVENOUS
  Filled 2016-11-02: qty 16

## 2016-11-02 MED ORDER — MORPHINE BOLUS VIA INFUSION
2.0000 mg | INTRAVENOUS | Status: DC | PRN
  Filled 2016-11-02: qty 2

## 2016-11-02 NOTE — Progress Notes (Signed)
Patient is on palliative care.  Patient's MAPs dropping to low 60s with levo at 15 mcgs.  Cards MD notified.  RN instructed to leave patient on 42 mcgs with no titration.  Will continue to monitor patient closely.

## 2016-11-02 NOTE — Progress Notes (Signed)
CRITICAL VALUE ALERT   Critical value received:  K 6.6 Ca 6.2  Date of notification:  11/02/16  Time of notification:  [id number]  Critical value read back:Yes.    Nurse who received alert:  n Weda Baumgarner rn  MD notified (1st page):  bensimhon  Time of first page: 1845  MD notified (2nd page):  Time of second page:  Responding MD:  bensimhon  Time MD responded:  [phone]

## 2016-11-02 NOTE — Progress Notes (Signed)
  Discussed situation with patient and his family at length and we went through options of  1. Aggressive care with attempt at CVVHD 2. Aggressive care with no CVVHD 3. Comfort care  Given comorbidities have decided against CVVHD. Will continue supportive care for now. Start low-dose morphine for now. Continue IV lasix. Follow serum creatinine trend.   Bensimhon, Daniel,MD 11:05 AM

## 2016-11-02 NOTE — Progress Notes (Signed)
Critical value K+ 6.4 received. C.Berge NP paged with cardiology. Return call received and orders obtained.

## 2016-11-02 NOTE — Progress Notes (Deleted)
Critical PTT 193 received. Patient is on CRRT and heparin is infusing for filter. ACTs are being monitored for this value.

## 2016-11-02 NOTE — Progress Notes (Signed)
Patient has not had any urine output. Patient has a foley catheter in place. Bladder does not feel distended on palpation. Patient denies discomfort. Bladder scan completed with zero urine detected in bladder. Will page cardiology fellow to inform them.

## 2016-11-02 NOTE — Progress Notes (Signed)
[facility]  I saw Jose Monroe and met with his daughter and wife this morning and I discussed the situation with Dr. Haroldine Laws this morning.  Sadly his renal function has worsened.  I don't think that CVVHD is a reasonable option.  Agree with palliative approach and family and patient agree.  Roselie Awkward, MD [place] [facility] Pager: [phone] Cell: [phone] After 3pm or if no response, call [phone]

## 2016-11-02 NOTE — Progress Notes (Signed)
Advanced Heart Failure Rounding Note   Subjective:    He was hypotensive yesterday and moved to ICU. Central access placed. Co-ox 41%.  Now on levophed 20. SBP 70-80s. More SOB. Not responding to IV lasix. K 6.4.  Received kayexalate this am.     Objective:   Weight Range:  Vital Signs:   Temp:  [97.1 F (36.2 C)-97.5 F (36.4 C)] 97.4 F (36.3 C) (11/12 0344) Pulse Rate:  [64-102] 70 (11/12 0745) Resp:  [14-37] 18 (11/12 0745) BP: (65-144)/(29-121) 102/32 (11/12 0745) SpO2:  [78 %-100 %] 100 % (11/12 0745) Last BM Date: 11/10/2016  Weight change: Filed Weights   11/17/2016 1639 11/01/16 0549  Weight: 76.1 kg (167 lb 11.2 oz) 77 kg (169 lb 11.2 oz)    Intake/Output:   Intake/Output Summary (Last 24 hours) at 11/02/16 0752 Last data filed at 11/02/16 0700  Gross per 24 hour  Intake          1744.28 ml  Output               36 ml  Net          1708.28 ml     Physical Exam: General:  Ill appearing. Mild resp difficulty HEENT: normal Neck: supple. JVP to jaw . RIJ TLC Carotids 2+ bilat; no bruits. No lymphadenopathy or thryomegaly appreciated. Cor: PMI nondisplaced. IRR IRR 2/6 AS S2 obliterated Lungs: diffuse crackles Abdomen: soft, nontender, +distended. No hepatosplenomegaly. No bruits or masses. Good bowel sounds. Extremities: no cyanosis, clubbing, rash, edema Neuro: alert & orientedx3, cranial nerves grossly intact. moves all 4 extremities w/o difficulty. Affect pleasant  Telemetry: AFL 80-90s  Labs: Basic Metabolic Panel:  Recent Labs Lab 10/26/2016 1857 11/01/16 0537 11/02/16 0614  NA 130* 131* 129*  K 5.1 5.6* 6.4*  CL 94* 96* 91*  CO2 25 16* 19*  GLUCOSE 222* 111* 86  BUN 26* 32* 50*  CREATININE 1.33* 1.75* 3.34*  CALCIUM 9.0 9.0 7.5*    Liver Function Tests:  Recent Labs Lab 11/08/2016 1857  AST 83*  ALT 153*  ALKPHOS 120  BILITOT 0.5  PROT 6.7  ALBUMIN 2.5*   No results for input(s): LIPASE, AMYLASE in the last 168 hours. No  results for input(s): AMMONIA in the last 168 hours.  CBC:  Recent Labs Lab 11/05/2016 1857  WBC 10.1  NEUTROABS 7.9*  HGB 11.6*  HCT 36.6*  MCV 96.1  PLT 417*    Cardiac Enzymes: No results for input(s): CKTOTAL, CKMB, CKMBINDEX, TROPONINI in the last 168 hours.  BNP: BNP (last 3 results) No results for input(s): BNP in the last 8760 hours.  ProBNP (last 3 results) No results for input(s): PROBNP in the last 8760 hours.    Other results:  Imaging: Dg Chest Port 1 View  Result Date: 11/01/2016 CLINICAL DATA:  Central venous catheter placement EXAM: PORTABLE CHEST 1 VIEW COMPARISON:  11/18/2016 FINDINGS: Right IJ catheter tip is in the SVC. No pneumothorax identified. The heart size is mildly enlarged. There is moderate diffuse pulmonary edema. Bilateral patchy airspace opacities are unchanged from previous exam. Small pleural effusions are noted left greater than right. When compared with the previous exam IMPRESSION: Right IJ catheter tip is at the cavoatrial junction. No complications identified. No change in aeration a lungs compared with previous exam. Electronically Signed   By: Kerby Moors M.D.   On: 11/01/2016 17:18   Dg Chest Port 1 View  Result Date: 11/01/2016 CLINICAL DATA:  Initial  evaluation for acute respiratory distress, shortness of breath. EXAM: PORTABLE CHEST 1 VIEW COMPARISON:  Prior radiograph from 10/15/2016. FINDINGS: Moderate cardiomegaly, stable from prior. Mediastinal silhouette within normal limits. Aortic atherosclerosis noted. Lungs are mildly hypoinflated. There is extensive pulmonary vascular congestion with indistinctness of the interstitial markings, suspected to largely reflect pulmonary edema. Multi focal infection could also have this appearance, and could be considered in the correct clinical setting. No definite pleural effusion. No pneumothorax. No acute osseous abnormality. Radiopaque metallic densities overlying the right axilla, stable.  IMPRESSION: 1. Cardiomegaly with diffuse pulmonary vascular congestion and indistinctness of the interstitial markings, greatest within the mid and lower lung zones bilaterally. Findings favored to reflect pulmonary edema, although extensive multifocal infection could also have this appearance. 2. Stable cardiomegaly. 3. Aortic atherosclerosis. Electronically Signed   By: Jeannine Boga M.D.   On: 11/01/2016 00:09      Medications:     Scheduled Medications: . apixaban  5 mg Oral BID  . mouth rinse  15 mL Mouth Rinse BID  . pantoprazole  40 mg Oral Daily  . predniSONE  40 mg Oral Q breakfast  . sodium polystyrene  30 g Oral Once  . THROMBI-PAD  1 each Topical Once     Infusions: . norepinephrine (LEVOPHED) Adult infusion 20 mcg/min (11/01/16 2100)     PRN Medications:  albuterol, lip balm, magnesium hydroxide, ondansetron (ZOFRAN) IV   Assessment:   1. Cardiogenic shock 2. AKI 3. Metastatic renal cell carcinoma 4. Severe aortic stenosis 5. Hyperkalemia 6. Acute on chronic hypoxic respiratory failure 7. 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 Hyponatremia    Plan/Discussion:    He is critically ill with multisystem organ failure. Unfortunately he continues to deteriorate despite inotropic support. Now with anuric renal failure (due to ATN) complicated by progressive acidosis and hyperkalemia.  Given metastatic renal cell CA, severe AS and recent ILD, I do not think he is a candidate for CVVHD. Will d/w CCM. Continue hemodynamic support with norepi for now and will give high-dose lasix and kayexalate.   I have d/w Dr. Lake Bells (CCM) who agrees that CVVHD is likely not a viable option for him.  I also called his son-in-law to discuss the situation. Will plan family meeting later this am to discuss prognosis and possibility of switching to comfort care.  Will give lasix 160 IV now.  Critical care time 45 mins'   Length of Stay: 2   Joy Reiger,  Kule Gascoigne 11/02/2016, 7:52 AM  Advanced Heart Failure Team Pager [phone] (M-F; 7a - 4p)  Please contact [facility] for night-coverage after hours (4p -7a ) and weekends on [email or web address]

## 2016-11-03 DIAGNOSIS — J849 Interstitial pulmonary disease, unspecified: Secondary | ICD-10-CM

## 2016-11-03 DIAGNOSIS — I10 Essential (primary) hypertension: Secondary | ICD-10-CM

## 2016-11-03 DIAGNOSIS — C799 Secondary malignant neoplasm of unspecified site: Secondary | ICD-10-CM

## 2016-11-03 DIAGNOSIS — E875 Hyperkalemia: Secondary | ICD-10-CM

## 2016-11-03 DIAGNOSIS — J9601 Acute respiratory failure with hypoxia: Secondary | ICD-10-CM

## 2016-11-03 DIAGNOSIS — C641 Malignant neoplasm of right kidney, except renal pelvis: Secondary | ICD-10-CM

## 2016-11-03 LAB — BASIC METABOLIC PANEL
Anion gap: 17 — ABNORMAL HIGH (ref 5–15)
BUN: 70 mg/dL — AB (ref 6–20)
CALCIUM: 5.7 mg/dL — AB (ref 8.9–10.3)
CHLORIDE: 90 mmol/L — AB (ref 101–111)
CO2: 21 mmol/L — AB (ref 22–32)
CREATININE: 4.28 mg/dL — AB (ref 0.61–1.24)
GFR calc Af Amer: 14 mL/min — ABNORMAL LOW (ref >60)
GFR calc non Af Amer: 12 mL/min — ABNORMAL LOW (ref >60)
Glucose, Bld: 186 mg/dL — ABNORMAL HIGH (ref 65–99)
Potassium: 6.5 mmol/L (ref 3.5–5.1)
SODIUM: 128 mmol/L — AB (ref 135–145)

## 2016-11-03 LAB — COOXEMETRY PANEL
CARBOXYHEMOGLOBIN: 0.7 % (ref 0.5–1.5)
Methemoglobin: 0.8 % (ref 0.0–1.5)
O2 SAT: 78.1 %
TOTAL HEMOGLOBIN: 11.5 g/dL — AB (ref 12.0–16.0)

## 2016-11-03 MED ORDER — SODIUM CHLORIDE 0.9 % IV SOLN
5.0000 mg/h | INTRAVENOUS | Status: DC
  Administered 2016-11-03: 5 mg/h via INTRAVENOUS
  Filled 2016-11-03: qty 10

## 2016-11-03 MED ORDER — GLYCOPYRROLATE 0.2 MG/ML IJ SOLN
0.1000 mg | Freq: Once | INTRAMUSCULAR | Status: AC
  Administered 2016-11-03: 0.1 mg via INTRAVENOUS
  Filled 2016-11-03: qty 1

## 2016-11-03 MED ORDER — SODIUM CHLORIDE 0.9 % IV SOLN
1.0000 g | Freq: Once | INTRAVENOUS | Status: AC
  Administered 2016-11-03: 1 g via INTRAVENOUS
  Filled 2016-11-03: qty 10

## 2016-11-05 ENCOUNTER — Institutional Professional Consult (permissible substitution): Payer: Medicare Other | Admitting: Pulmonary Disease

## 2016-11-05 ENCOUNTER — Ambulatory Visit: Payer: Medicare Other | Admitting: Acute Care

## 2016-11-05 ENCOUNTER — Encounter (HOSPITAL_COMMUNITY): Payer: Medicare Other

## 2016-11-06 ENCOUNTER — Ambulatory Visit: Payer: Medicare Other | Admitting: Cardiology

## 2016-11-21 NOTE — Progress Notes (Addendum)
Advanced Heart Failure Rounding Note   Subjective:    Now on morphine gtt for comfort. Remains on norepi.   Sedated. Not arousable.   Creatinine worsening. Remains anuric despite high dose lasix.     Objective:   Weight Range:  Vital Signs:   Temp:  [97.3 F (36.3 C)-97.7 F (36.5 C)] 97.5 F (36.4 C) (11/12 2315) Pulse Rate:  [78-103] 101 (11/13 0808) Resp:  [6-27] 10 (11/13 0808) BP: (88-130)/(35-65) 112/63 (11/13 0808) SpO2:  [92 %-100 %] 100 % (11/13 0800) FiO2 (%):  [40 %] 40 % (11/12 1621) Last BM Date: 11/02/16  Weight change: Filed Weights   11/02/2016 1639 11/01/16 0549  Weight: 76.1 kg (167 lb 11.2 oz) 77 kg (169 lb 11.2 oz)    Intake/Output:   Intake/Output Summary (Last 24 hours) at 2016-11-15 0945 Last data filed at 2016/11/15 0800  Gross per 24 hour  Intake           898.57 ml  Output               66 ml  Net           832.57 ml     Physical Exam: General:  Sedated some gurgling Non-responsive HEENT: normal Neck: supple. JVP to jaw . RIJ TLC Carotids 2+ bilat; no bruits. No lymphadenopathy or thryomegaly appreciated. Cor: PMI nondisplaced. IRR IRR 2/6 AS S2 obliterated Lungs: diffuse crackles Abdomen: soft, nontender, +distended. No hepatosplenomegaly. No bruits or masses. Good bowel sounds. Extremities: no cyanosis, clubbing, rash, 2-3+ edema Neuro:sedated  Telemetry: AFL 90-100s  Labs: Basic Metabolic Panel:  Recent Labs Lab 11/01/16 0537 11/02/16 0614 11/02/16 1100 11/02/16 1745 11-15-2016 0500  NA 131* 129* 126* 126* 128*  K 5.6* 6.4* 6.3* 6.5* 6.5*  CL 96* 91* 90* 88* 90*  CO2 16* 19* 17* 19* 21*  GLUCOSE 111* 86 113* 163* 186*  BUN 32* 50* 54* 60* 70*  CREATININE 1.75* 3.34* 3.55* 3.77* 4.28*  CALCIUM 9.0 7.5* 6.9* 6.2* 5.7*    Liver Function Tests:  Recent Labs Lab 11/09/2016 1857  AST 83*  ALT 153*  ALKPHOS 120  BILITOT 0.5  PROT 6.7  ALBUMIN 2.5*   No results for input(s): LIPASE, AMYLASE in the last 168  hours. No results for input(s): AMMONIA in the last 168 hours.  CBC:  Recent Labs Lab 10/27/2016 1857  WBC 10.1  NEUTROABS 7.9*  HGB 11.6*  HCT 36.6*  MCV 96.1  PLT 417*    Cardiac Enzymes: No results for input(s): CKTOTAL, CKMB, CKMBINDEX, TROPONINI in the last 168 hours.  BNP: BNP (last 3 results) No results for input(s): BNP in the last 8760 hours.  ProBNP (last 3 results) No results for input(s): PROBNP in the last 8760 hours.    Other results:  Imaging: Dg Chest Port 1 View  Result Date: 11/01/2016 CLINICAL DATA:  Central venous catheter placement EXAM: PORTABLE CHEST 1 VIEW COMPARISON:  10/30/2016 FINDINGS: Right IJ catheter tip is in the SVC. No pneumothorax identified. The heart size is mildly enlarged. There is moderate diffuse pulmonary edema. Bilateral patchy airspace opacities are unchanged from previous exam. Small pleural effusions are noted left greater than right. When compared with the previous exam IMPRESSION: Right IJ catheter tip is at the cavoatrial junction. No complications identified. No change in aeration a lungs compared with previous exam. Electronically Signed   By: Kerby Moors M.D.   On: 11/01/2016 17:18     Medications:  Scheduled Medications: . furosemide  160 mg Intravenous Q8H  . mouth rinse  15 mL Mouth Rinse BID  . pantoprazole  40 mg Oral Daily  . predniSONE  40 mg Oral Q breakfast  . THROMBI-PAD  1 each Topical Once    Infusions: . morphine 1 mg/hr (2016-12-01 0800)  . norepinephrine (LEVOPHED) Adult infusion 15.04 mcg/min (December 01, 2016 0800)    PRN Medications: albuterol, guaiFENesin-dextromethorphan, lip balm, magnesium hydroxide, morphine, ondansetron (ZOFRAN) IV   Assessment:   1. Cardiogenic shock 2. AKI 3. Metastatic renal cell carcinoma 4. Severe aortic stenosis 5. Hyperkalemia 6. Acute on chronic hypoxic respiratory failure 7. 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 Hyponatremia    Plan/Discussion:    He  is critically ill with progressive multisystem organ failure. Unfortunately he continues to deteriorate despite inotropic support. Now with anuric renal failure (due to ATN) complicated by progressive acidosis and hyperkalemia.   Given metastatic renal cell CA, severe AS and recent ILD, he is not  candidate for CVVHD.  Now on morphine gtt for comfort. Will discuss further with family and likely switch to full comfort care today.    Critical care time 35 mins   Length of Stay: 3  Natsumi Whitsitt MD 12-01-16, 9:45 AM  Advanced Heart Failure Team Pager [phone] (M-F; [place])  Please contact [facility] for night-coverage after hours (4p -7a ) and weekends on [email or web address]

## 2016-11-21 NOTE — Progress Notes (Addendum)
Two RNs (Jesse [place] and [place]) listened for one minute. No heart or lung sounds were auscultated. CDS notified. [place] contacted for family.EKG strip saved. Wife at beds side. All questions answered and support given. Heart failure team notified.   Cosigned by Zigmund Gottron, RN

## 2016-11-21 NOTE — Discharge Summary (Signed)
Discharge Summary / Death Note   Jose Monroe MRN: [id number] DOB: 07-04-1941  Admit Date: 11-17-16 Time of Death:  13:51 Date of Death: 11-20-16  Attending Physician: Lelon Perla, MD PCP: Annye Asa, MD  Cause of Death: End-stage renal failure  Hospital Diagnoses:  Principal Problem:   AKI (acute kidney injury) [facility]) Active Problems:   Aortic stenosis, severe   Essential hypertension   Renal cell carcinoma of right kidney metastatic to other site [facility])   ILD (interstitial lung disease) ([place])   Atrial flutter with rapid ventricular response ([facility])   Cardiogenic shock ([place])   Acute respiratory distress   Procedures:   TEE (10/21/2016) PROCEDURE NOTE:  Procedure:  Transesophageal echocardiogram Operator:  Fransico Him, MD Indications:  Atrial flutter Complications: None  During this procedure the patient is administered a total of 122mg  of Propofol to achieve  sedation.  The patient's heart rate, blood pressure, and oxygen saturation are monitored continuously during the procedure by anesthesia.  Results: Normal LV size and function Normal RV size and function Normal RA Moderately dilated LA with spontaneous echo contrast.  The LA appendage is poorly seen due to shadowing from a marked prominence that prevents full visualization of the entire appendage in multiple views.  Cannot, with confidence, rule out thrombus.   Normal TV with trivial TR Normal PV Normal MV with trivial MR Severely thickened and heavily calcified trileaflet AV.  The leaflet are essentially fixed with the AVA by planimetry 0.3cm2 c/w critical AS.  There is mild to moderate AR. Marked lipomatous hypertrophy of the interatrial septum with no evidence of shunt by colorflow dopper  Moderate atherosclerotic plaque of the  thoracic and ascending aorta.  The patient tolerated the procedure well but no DCCV was performed due to inability to see the entire appendage.    The  patient's HR was 106bpm at rest.  Metoprolol increased to 25mg  BID and he will follow up 11/3 int he office.  He will need 2 more weeks on Eliquis prior to DCCV.  Traci Turner 10/21/2016, 11:25 AM  Central line placement (11/01/2016):  Central Venous Catheter Insertion Procedure Note Jose Monroe [id number] [birth year]  Procedure: Insertion of Central Venous Catheter Indications: Assessment of intravascular volume  Procedure Details Consent: Risks of procedure as well as the alternatives and risks of each were explained to the (patient/caregiver).  Consent for procedure obtained. Time Out: Verified patient identification, verified procedure, site/side was marked, verified correct patient position, special equipment/implants available, medications/allergies/relevent history reviewed, required imaging and test results available.  Performed  Maximum sterile technique was used including antiseptics, cap, gloves, gown, hand hygiene, mask and sheet. Skin prep: Chlorhexidine; local anesthetic administered A antimicrobial bonded/coated triple lumen catheter was placed in the right internal jugular vein using the Seldinger technique.  Evaluation Blood flow good Complications: No apparent complications Patient did tolerate procedure well. Chest X-ray ordered to verify placement.  CXR: pending.  Glori Bickers MD 11/01/2016, 4:46 PM   Hostpital Course: 75 y.o.malewith PMH of HTN, HLD, h/o moderate to severe AS, paroxysmal atrial flutter and metastaticrenal cell carcinoma. Echocardiogram obtained on 05/03/2015 showed EF 60-65% and mild LVH, grade 1 diastolic dysfunction, moderate to severe aortic stenosis, mean gradient 38 mmHg, peak gradient 74 mmHg.he had a history of renal cell cancer and apparently developed metastasis in the bone this spring, he suffered a pathologic shoulder fracture that required shoulder displacement in May. Metastasis was noted in his lung as well. He had a  chemotherapy over summer  but he developed interstitial lung disease. He was admitted to [facility] with respiratory failure in September. He and his wife has decided for him not to be intubated. He had a long hospital course lasting 17 days, hospice was recommended but the patient and his wife wanted to pursue medical therapy to keep him as strong as possible without going to any heroic measures. Prior to hospitalization it was determined that he wished to be DNR. He gets around using a wheelchair at home. He has been transitioned to follow-up with Dr. Lake Bells. He was last seen in the office on 10/16/2016, he was in atrial flutter with ventricular rate of 160 at the time. Metoprolol tartrate 12.5 mg twice a day and eliquis 5 mg twice a day was added. He underwent a scheduled TEE DCCV on 10/31, however, due to inability to see entire left atrial appendage, cardioversion was not pursued. TEE also showed severely thickened and heavily calcified trileaflet aortic valve with area 0.3 cm consistent with critical areas. There is also moderate to severe AR. He was then admitted for acute on chronic diastolic congestive heart failure on 11/08/2016.  He had little urine output with lasix, requiring increasing doses. He was seen by Dr. Haroldine Laws of the advanced heart failure service and a central venous catheter was placed. He was started on levophed. Co-ox was 41% - he developed progressive anuric renal failure and hyperkalemia.  Not thought to be a candidate for CVVHD. Dr. Haroldine Laws discussed the case with his son and Dr. Lake Bells with PCCM and recommended comfort care. Aggressive support was discontinued and the patient was pronounced dead by 2 nurses at 13:51 on 11/21/2016.  Pixie Casino, MD, [facility] Attending Cardiologist [facility]  2016-11-21 3:08 PM

## 2016-11-21 NOTE — Progress Notes (Signed)
   11/17/16 1241  Clinical Encounter Type  Visited With Patient and family together  Visit Type Death  Referral From Chaplain  Consult/Referral To Chaplain  Spiritual Encounters  Spiritual Needs Emotional;Grief support  Stress Factors  Family Stress Factors Family relationships;Major life changes  pt.Code, death. Grief and emotional support to family, ministry of presence, placement specialist card, family was already accepting of pt.death, spiritual support with prayer closing salutations.  [employer] [phone]

## 2016-11-21 NOTE — Progress Notes (Signed)
CRITICAL VALUE ALERT  Critical value received:  Potassium 6.5; Calcium 5.7  Date of notification:  11-08-16  Time of notification:  [id number]  Critical value read back:Yes.    Nurse who received alert:  Guadalupe Dawn RN  MD notified (1st page):  Turner MD  Time of first page:  [phone]

## 2016-11-21 NOTE — Progress Notes (Signed)
PULMONARY / CRITICAL CARE MEDICINE   Name: Jose Monroe MRN: [id number] DOB: [birth year]    ADMISSION DATE:  11/20/2016 CONSULTATION DATE:  11/01/2016  REFERRING MD:  Dr. Meda Coffee  CHIEF COMPLAINT:  Dyspnea  HISTORY OF PRESENT ILLNESS:   75 year old male well known to me from a recent clinic consultation for chronic respiratory failure with hypoxemia in the setting of interstitial lung disease thought secondary to cancer treatment.  PMH of renal cell carcinoma s/p chemo  / XRT.  04/2016 found to have "a spot on his lung" and FOB was consistent with recurrent renal cell carcinoma treated with Vorient (had a severe reaction with kidney / liver involvement).  He later was started on Opdivo (recieved 2 doses).  He developed cough and underwent CT scan at [facility] which was worrisome for fibrosis.  Most recent baseline O2 has been 2-4L.   He was seen by Cardiology on multiple occasions the first of November for increased swelling and tachycardia.  Lopressor & diuretics were adjusted - discussions held regarding cardioversion / IV amiodarone if needed.  Started on apixaban 10/26.  Admitted 11/10 with concerns for decompensated CHF.  Found to be in [place].   he was started on amiodarone. However, today he's noted increasing leg swelling and oliguria.  He reports no change in his baseline cough but is noting notably increased leg swelling compared to baseline. No abdominal pain. He does have some increased shortness of breath. No chest pain. He is new to the intensive care unit for initiation of vasopressors. Notably, he has critical aortic stenosis.  His oncology doctor decreased the dose of his prednisone this past week.  SUBJECTIVE:  Pt obtunded on MSO4 gtt.  Wife at bedside and aware that he cannot recover.  norepi still running at [place]: BP 112/63   Pulse (!) 101   Temp 97.5 F (36.4 C) (Oral)   Resp 10   Ht 5\' 11"  (1.803 m)   Wt 77 kg (169 lb 11.2 oz)   SpO2 100%   BMI 23.67 kg/m    HEMODYNAMICS: CVP:  [11 mmHg] 11 mmHg  VENTILATOR SETTINGS: FiO2 (%):  [40 %] 40 %  INTAKE / OUTPUT: I/O last 3 completed shifts: In: 2410.9 [P.O.:1500; I.V.:602.9; IV Piggyback:308] Out: 42 [Urine:75; Stool:3]  PHYSICAL EXAMINATION: General:  Chronically ill appearing but comfortable in bed Neuro:  Awake, alert, oriented 4 HEENT:  Normocephalic atraumatic, oropharynx clear Cardiovascular:  Irregularly irregular, normal rate, no JVD Lungs:  Crackles bases bilaterally Abdomen:  Belly soft, nontender Musculoskeletal:  Normal bulk and tone Skin:  Increased edema to pretibial area bilaterally compared to when I last examined him on 10/25.  LABS:  BMET  Recent Labs Lab 11/02/16 1100 11/02/16 1745 12/01/2016 0500  NA 126* 126* 128*  K 6.3* 6.5* 6.5*  CL 90* 88* 90*  CO2 17* 19* 21*  BUN 54* 60* 70*  CREATININE 3.55* 3.77* 4.28*  GLUCOSE 113* 163* 186*    Electrolytes  Recent Labs Lab 11/02/16 1100 11/02/16 1745 2016/12/01 0500  CALCIUM 6.9* 6.2* 5.7*    CBC  Recent Labs Lab 11/11/2016 1857  WBC 10.1  HGB 11.6*  HCT 36.6*  PLT 417*    Coag's No results for input(s): APTT, INR in the last 168 hours.  Sepsis Markers No results for input(s): LATICACIDVEN, PROCALCITON, O2SATVEN in the last 168 hours.  ABG No results for input(s): PHART, PCO2ART, PO2ART in the last 168 hours.  Liver Enzymes  Recent Labs Lab 11/14/2016 1857  AST 83*  ALT 153*  ALKPHOS 120  BILITOT 0.5  ALBUMIN 2.5*    Cardiac Enzymes No results for input(s): TROPONINI, PROBNP in the last 168 hours.  Glucose No results for input(s): GLUCAP in the last 168 hours.  Imaging No results found.  Chest x-ray images from this hospitalization reviewed and compared to the study from October 25. There is a notable increase in bilateral airspace disease without significant pleural effusions.    DISCUSSION: 75 year old male with a very complex past medical history significant for  metastatic renal cell carcinoma, interstitial lung disease felt to be related to anticancer treatment, chronic a flutter and atrial fibrillation with severe aortic stenosis admitted for acute respiratory distress in the setting of notable volume overload secondary to pulmonary edema. Has worsened dspite aggressive medical rx. Renal failure progressing. Now obtunded / comfortable on MSO4. Agree with palliative approach. I will update Dr Lake Bells. Appreciate cardiology's care  ASSESSMENT / PLAN:  PULMONARY A: Chronic interstitial lung disease felt to be related to anticancer treatment (nivolimab) Acute pulmonary edema Chronic respiratory failure with hypoxemia Inability to protect airway.  P:   Suction prn for oral secretions, consider scopolamine patch  DNR /I   CARDIOVASCULAR A:  Cardiogenic shock Atrial fibrillation/A flutter Critical aortic stenosis P:  Family has transitioned to Palliation. They have all made their peace with current situation, want him to be comfortable. Favor weaning off norepi when they are ready.   Baltazar Apo, MD, PhD Dec 01, 2016, 9:25 AM [facility] [phone] or if no answer [phone]

## 2016-11-21 NOTE — Progress Notes (Addendum)
200 ml morphine and 25 ml versed wasted in sink. Two RN's to verify Coletta Memos and Joseph Berkshire).

## 2016-11-21 DEATH — deceased

## 2017-02-10 ENCOUNTER — Ambulatory Visit: Payer: Medicare Other | Admitting: Family Medicine

## 2017-09-14 IMAGING — CR DG CHEST 1V PORT
1 series · 1 of 1 positions shown · non-contrast
Comparison: Prior radiograph from 10/15/2016.

CLINICAL DATA: Initial evaluation for acute respiratory distress,
shortness of breath.

EXAM:
PORTABLE CHEST 1 VIEW

[AP]
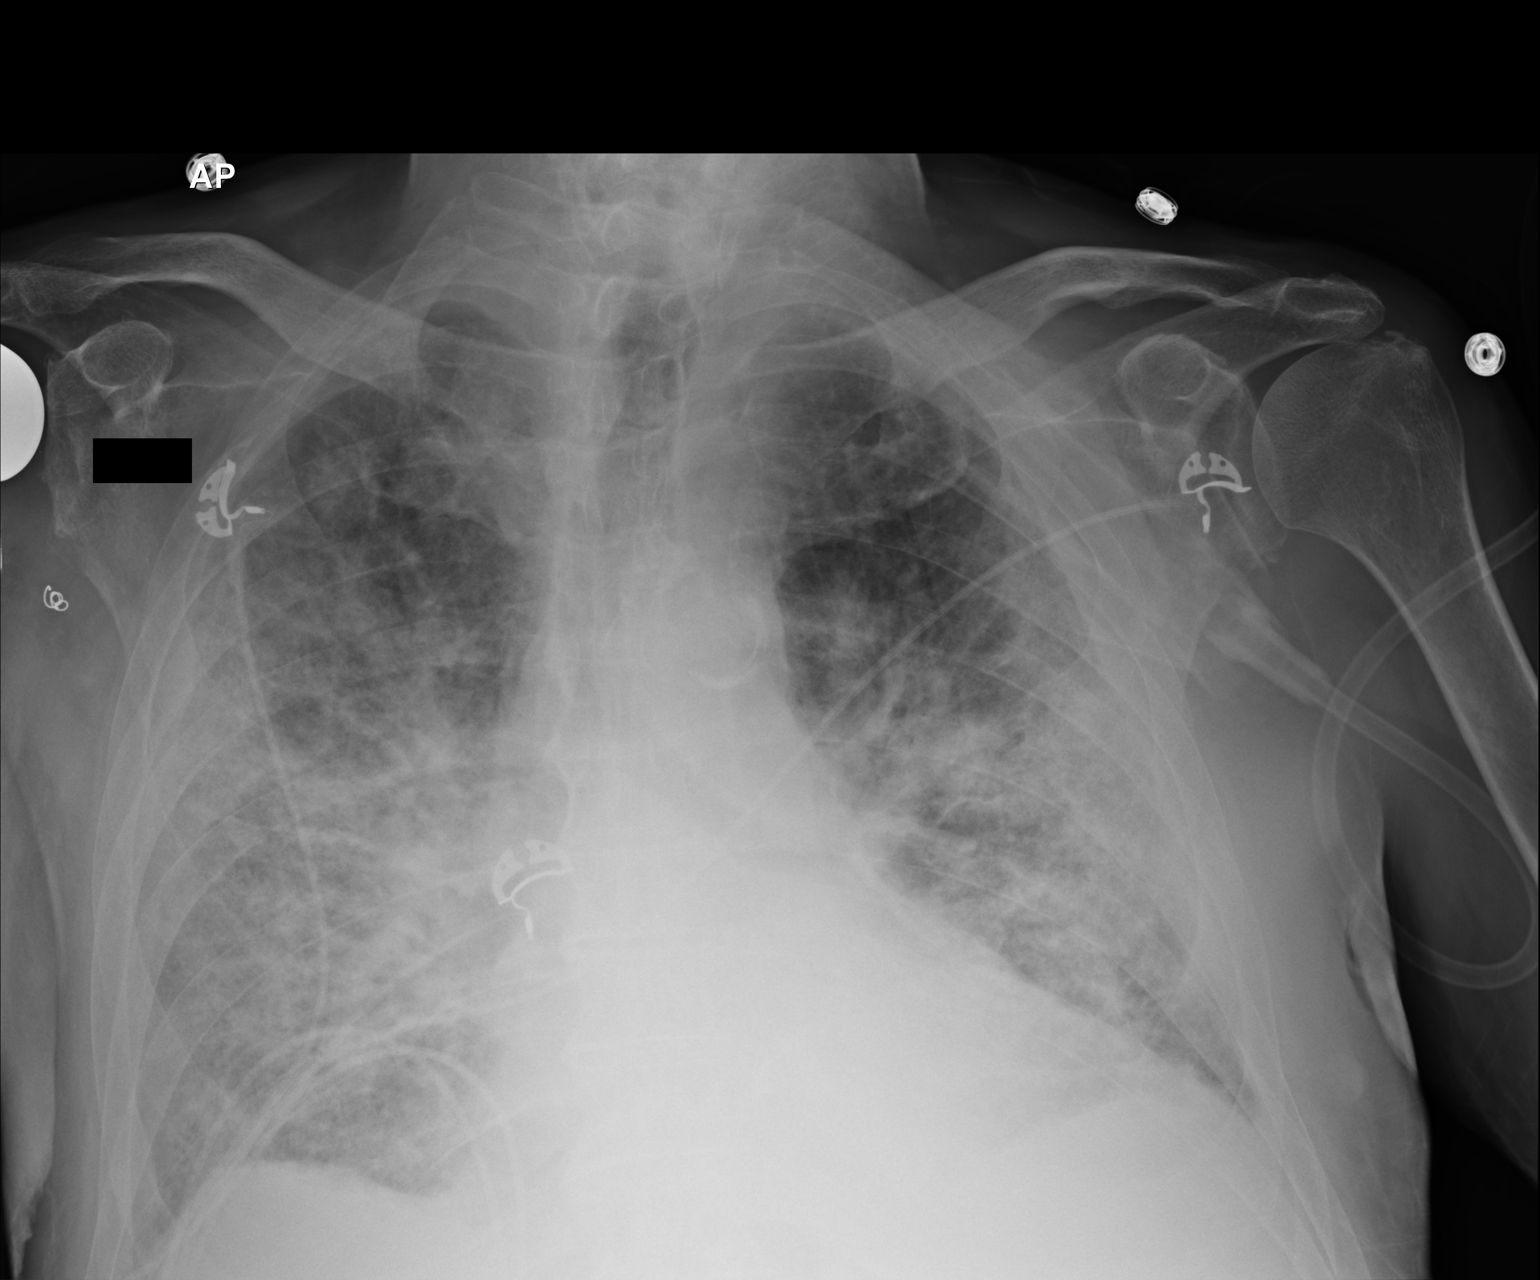

[1 of 1 positions shown; findings below may reference images not displayed]

FINDINGS: Moderate cardiomegaly, stable from prior. Mediastinal silhouette
within normal limits. Aortic atherosclerosis noted.

Lungs are mildly hypoinflated. There is extensive pulmonary vascular
congestion with indistinctness of the interstitial markings,
suspected to largely reflect pulmonary edema. Multi focal infection
could also have this appearance, and could be considered in the
correct clinical setting. No definite pleural effusion. No
pneumothorax.

No acute osseous abnormality. Radiopaque metallic densities
overlying the right axilla, stable.
IMPRESSION: 1. Cardiomegaly with diffuse pulmonary vascular congestion and
indistinctness of the interstitial markings, greatest within the mid
and lower lung zones bilaterally. Findings favored to reflect
pulmonary edema, although extensive multifocal infection could also
have this appearance.
2. Stable cardiomegaly.
3. Aortic atherosclerosis.

## 2017-09-15 IMAGING — CR DG CHEST 1V PORT
1 series · 1 of 1 positions shown · non-contrast
Comparison: 10/31/2016

CLINICAL DATA: Central venous catheter placement

EXAM:
PORTABLE CHEST 1 VIEW

[AP]
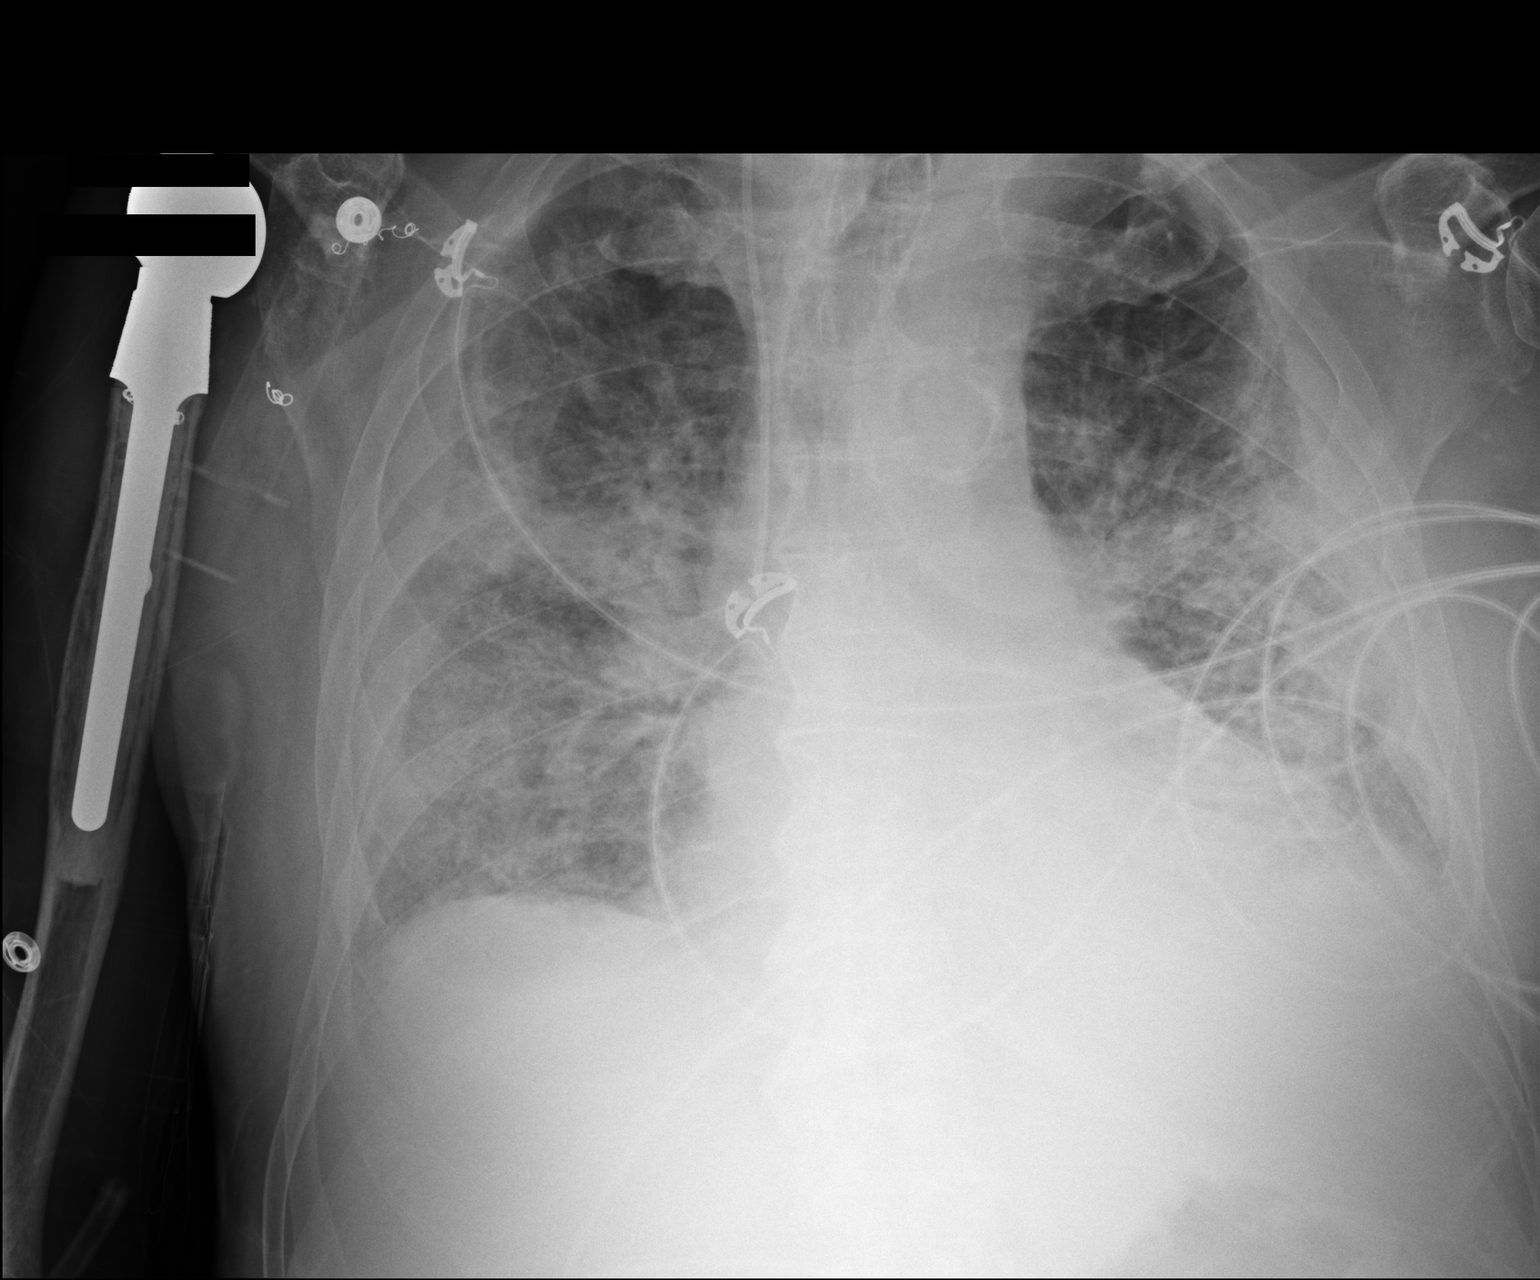

[1 of 1 positions shown; findings below may reference images not displayed]

FINDINGS: Right IJ catheter tip is in the SVC. No pneumothorax identified. The
heart size is mildly enlarged. There is moderate diffuse pulmonary
edema. Bilateral patchy airspace opacities are unchanged from
previous exam. Small pleural effusions are noted left greater than
right. When compared with the previous exam
IMPRESSION: Right IJ catheter tip is at the cavoatrial junction. No
complications identified.

No change in aeration a lungs compared with previous exam.
